# Patient Record
Sex: Male | Born: 1955 | Race: White | Hispanic: No | Marital: Married | Smoking: Former smoker
Health system: Southern US, Community
[De-identification: ages and names within clinical notes are randomized; demographics above are authoritative.]

## PROBLEM LIST (undated history)

## (undated) DIAGNOSIS — J189 Pneumonia, unspecified organism: Secondary | ICD-10-CM

## (undated) DIAGNOSIS — G4730 Sleep apnea, unspecified: Secondary | ICD-10-CM

## (undated) DIAGNOSIS — C801 Malignant (primary) neoplasm, unspecified: Secondary | ICD-10-CM

## (undated) DIAGNOSIS — G479 Sleep disorder, unspecified: Secondary | ICD-10-CM

## (undated) DIAGNOSIS — M1990 Unspecified osteoarthritis, unspecified site: Secondary | ICD-10-CM

## (undated) DIAGNOSIS — I10 Essential (primary) hypertension: Secondary | ICD-10-CM

## (undated) DIAGNOSIS — Z87442 Personal history of urinary calculi: Secondary | ICD-10-CM

## (undated) HISTORY — DX: Sleep disorder, unspecified: G47.9

## (undated) HISTORY — PX: CYSTOSCOPY WITH HOLMIUM LASER LITHOTRIPSY: SHX6639

## (undated) HISTORY — PX: APPENDECTOMY: SHX54

## (undated) HISTORY — PX: FRACTURE SURGERY: SHX138

## (undated) HISTORY — PX: TONSILLECTOMY: SUR1361

---

## 1997-08-03 ENCOUNTER — Ambulatory Visit (HOSPITAL_COMMUNITY): Admission: RE | Admit: 1997-08-03 | Discharge: 1997-08-03 | Payer: Self-pay | Admitting: Family Medicine

## 1998-06-10 ENCOUNTER — Encounter: Payer: Self-pay | Admitting: Emergency Medicine

## 1998-06-10 ENCOUNTER — Emergency Department (HOSPITAL_COMMUNITY): Admission: EM | Admit: 1998-06-10 | Discharge: 1998-06-10 | Payer: Self-pay | Admitting: Emergency Medicine

## 2001-11-25 ENCOUNTER — Encounter: Payer: Self-pay | Admitting: Emergency Medicine

## 2001-11-25 ENCOUNTER — Emergency Department (HOSPITAL_COMMUNITY): Admission: AC | Admit: 2001-11-25 | Discharge: 2001-11-25 | Payer: Self-pay

## 2002-02-13 ENCOUNTER — Encounter (INDEPENDENT_AMBULATORY_CARE_PROVIDER_SITE_OTHER): Payer: Self-pay | Admitting: *Deleted

## 2002-02-13 ENCOUNTER — Inpatient Hospital Stay (HOSPITAL_COMMUNITY): Admission: EM | Admit: 2002-02-13 | Discharge: 2002-02-14 | Payer: Self-pay | Admitting: *Deleted

## 2006-10-12 ENCOUNTER — Ambulatory Visit: Payer: Self-pay | Admitting: Gastroenterology

## 2006-10-24 ENCOUNTER — Ambulatory Visit: Payer: Self-pay | Admitting: Gastroenterology

## 2006-10-24 ENCOUNTER — Encounter: Payer: Self-pay | Admitting: Gastroenterology

## 2010-07-15 NOTE — Op Note (Signed)
NAME:  Mark Li, Mark Li NO.:  [id number]   MEDICAL RECORD NO.:  [id number]                   PATIENT TYPE:  INP   LOCATION:  5725                                 FACILITY:  [facility]   PHYSICIAN:  Ollen Gross. Vernell Morgans, M.D.              DATE OF BIRTH:  1955-05-01   DATE OF PROCEDURE:  DATE OF DISCHARGE:  02/14/2002                                 OPERATIVE REPORT   PREOPERATIVE DIAGNOSIS:  Appendicitis.   POSTOPERATIVE DIAGNOSIS:  Appendicitis.   PROCEDURE:  Laparoscopic appendectomy.   SURGEON:  Dr. Carolynne Edouard.   ANESTHESIA:  General endotracheal.   DESCRIPTION OF PROCEDURE:  After informed consent was obtained, the patient  was brought to the operating room and placed into the supine position on the  operating room table.  After adequate induction of general anesthesia, the  patient's abdomen was prepped with Betadine and draped in the usual sterile  manner.  The area above the umbilicus was infiltrated with 0.25% Marcaine  and a small incision was made with the 15 blade knife.  This incision was  carried down through the subcutaneous tissue bluntly with Kelly clamps and  Avary retractors, the linea alba was identified.  The linea alba was incised  with a 15 blade knife and each side was grasped with Kocher clamps and  elevated anteriorly.  The properitoneal space was then probed bluntly with  the hemostat until the peritoneum was opened and access was gained to the  abdominal cavity.  0 Vicryl purse string stitch was then placed in the  fascia surrounding this opening.  Hasson cannula was placed through this  opening and anchored in place with previously placed Vicryl purse string  stitch.  The abdomen was then insufflated with carbon dioxide without  difficulty.  The patient was placed in Trendelenburg position with the left  side down and the right side of the abdomen was inspected.  The cecum was  found to be sitting in a fairly high position, near the  inferior edge of the  liver.  The appendix was identified and it appeared to be inflamed.  Next,  sites were chosen on the abdomen, one in the lower midline and the other in  the epigastric area for placement of working ports.  These areas were  infiltrated with 0.25% Marcaine.  Small incisions were made with the 15  blade knife, and in the epigastric region, a 5 mm port, in the suprapubic  region, a 12 mm port were placed bluntly through these incisions, into the  abdominal cavity under direct vision.  Next, a Boston retractor was placed  through the upper working port and used to grasp the appendix and elevate it  anteriorly.  A harmonic scalpel was placed through the 12 mm port and using  the harmonic scalpel, the mesoappendix was taken down sharply.  Once this  was accomplished, the base of the appendix at  its junction with the cecum  was able to be identified and was cleared of any debris.  An endoscopic GIA  stapler was then placed through the 12 mm port and pushed across the base of  the appendix at its junction with the cecum.  There was good visualization  of this.  The stapling device was then clamped and fired, dividing the base  of the appendix without difficulty.  The stapling device was then released  and removed from the patient.  An endoscopic bag was placed through the 12  mm port and the appendix was placed within the bag and the bag was sealed.  The staple line at the cecum was inspected and appeared to be intact and  hemostatic.  The area was then irrigated with copious amounts of saline.  The camera was then moved to the 12 mm port and endoscopic grasper was  placed through the Hasson cannula and used to grasp the endoscopic bag.  The  bag with the appendix was then removed with the Hasson cannula through the  supraumbilical port.  The fascial defect was then closed with the previously  placed Vicryl purse string stitch.  The rest of the ports were removed under  direct  vision and were hemostatic.  The gas was allowed to escape through  the suprapubic 12 mm port, once removed, the fascia was closed with an  interrupted 0 Vicryl stitch.  The skin incisions were closed with interrupted 4-0 Monocryl subcuticular  stitches, Benzoin and Steri-Strips were applied.  The patient tolerated the  procedure well.  At the end of the case, all needle, sponge and instrument  counts were correct.  The patient was then awakened and taken to the recover  room in stable condition.                                               Ollen Gross. Vernell Morgans, M.D.    PST/MEDQ  D:  02/24/2002  T:  02/24/2002  Job:  [id number]

## 2011-06-01 ENCOUNTER — Ambulatory Visit (INDEPENDENT_AMBULATORY_CARE_PROVIDER_SITE_OTHER): Payer: BC Managed Care – PPO | Admitting: Internal Medicine

## 2011-06-01 VITALS — BP 145/91 | HR 62 | Temp 98.2°F | Resp 16 | Ht 72.0 in | Wt 246.0 lb

## 2011-06-01 DIAGNOSIS — R51 Headache: Secondary | ICD-10-CM

## 2011-06-01 DIAGNOSIS — J029 Acute pharyngitis, unspecified: Secondary | ICD-10-CM

## 2011-06-01 LAB — POCT RAPID STREP A (OFFICE): Rapid Strep A Screen: NEGATIVE

## 2011-06-01 LAB — POCT CBC
Granulocyte percent: 53.2 %G (ref 37–80)
HCT, POC: 50.3 % (ref 43.5–53.7)
Hemoglobin: 16.8 g/dL (ref 14.1–18.1)
MCV: 93.3 fL (ref 80–97)
POC LYMPH PERCENT: 34.7 %L (ref 10–50)
RDW, POC: 14 %

## 2011-06-01 MED ORDER — IPRATROPIUM BROMIDE 0.06 % NA SOLN: 2.0000 | Freq: Four times a day (QID) | NASAL | Status: DC

## 2011-06-01 MED ORDER — KETOROLAC TROMETHAMINE 60 MG/2ML IM SOLN
60.0000 mg | Freq: Once | INTRAMUSCULAR | Status: AC
  Administered 2011-06-01: 60 mg via INTRAMUSCULAR

## 2011-06-01 MED ORDER — MELOXICAM 7.5 MG PO TABS: 7.5000 mg | ORAL_TABLET | Freq: Every day | ORAL | Status: DC

## 2011-06-01 NOTE — Patient Instructions (Addendum)
Take your Mobic as needed for headache and use your nasal spray for congestion.  Return or call if not better in 2-3 days.  Recheck your blood pressure and record over the next month, it may be time to consider medication for your high blood pressure.   Upper Respiratory Infection, Adult An upper respiratory infection (URI) is also known as the common cold. It is often caused by a type of germ (virus). Colds are easily spread (contagious). You can pass it to others by kissing, coughing, sneezing, or drinking out of the same glass. Usually, you get better in 1 or 2 weeks.  HOME CARE   Only take medicine as told by your doctor.   Use a warm mist humidifier or breathe in steam from a hot shower.   Drink enough water and fluids to keep your pee (urine) clear or pale yellow.   Get plenty of rest.   Return to work when your temperature is back to normal or as told by your doctor. You may use a face mask and wash your hands to stop your cold from spreading.  GET HELP RIGHT AWAY IF:   After the first few days, you feel you are getting worse.   You have questions about your medicine.   You have chills, shortness of breath, or brown or red spit (mucus).   You have yellow or brown snot (nasal discharge) or pain in the face, especially when you bend forward.   You have a fever, puffy (swollen) neck, pain when you swallow, or white spots in the back of your throat.   You have a bad headache, ear pain, sinus pain, or chest pain.   You have a high-pitched whistling sound when you breathe in and out (wheezing).   You have a lasting cough or cough up blood.   You have sore muscles or a stiff neck.  MAKE SURE YOU:   Understand these instructions.   Will watch your condition.   Will get help right away if you are not doing well or get worse.  Document Released: 08/02/2007 Document Revised: 02/02/2011 Document Reviewed: 06/20/2010 [facility] Patient Information 2012 [place], [place].

## 2011-06-01 NOTE — Progress Notes (Signed)
  Subjective:    Patient ID: Mark Li, male    DOB: [birth year], 56 y.o.   MRN: [id number]  URI  This is a new problem. The current episode started in the past 7 days. The problem has been gradually improving. There has been no fever. Associated symptoms include headaches, rhinorrhea and sneezing. Pertinent negatives include no coughing, neck pain, plugged ear sensation, vomiting or wheezing. He has tried nothing for the symptoms.  Mark Li is here with complaints of sore throat which started four days ago, accompanied by rhinitis.  His headache started yesterday evening and is across the top of his forehead and face, he is carrying a roll of toilet paper to manage his rhinitis. His headache pain is persistant, not throbbing, sort of "like a toothache".   He denies allergies.  He has had no cough, fever, chills or stomach problems.  He has not taken any medication for his headache which did not prevent him from sleeping but was present on awakening.  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  No joint pain or rash.  In the past he has been told he has hypertension but he does not think he needs medication for this.    Review of Systems  HENT: Positive for rhinorrhea and sneezing. Negative for neck pain.   Respiratory: Negative for cough and wheezing.   Gastrointestinal: Negative for vomiting.  Neurological: Positive for headaches.  All other systems reviewed and are negative.       Objective:   Physical Exam  Vitals reviewed. Constitutional: He is oriented to person, place, and time. He appears well-developed and well-nourished.  HENT:  Head: Normocephalic.  Right Ear: External ear normal.  Left Ear: External ear normal.  Mouth/Throat: Oropharynx is clear and moist. No oropharyngeal exudate.  Eyes: Conjunctivae are normal.  Neck: Neck supple.  Cardiovascular: Normal rate, regular rhythm and normal heart sounds.   Pulmonary/Chest: Effort normal and breath  sounds normal.  Abdominal: Soft.  Lymphadenopathy:    He has no cervical adenopathy.  Neurological: He is alert and oriented to person, place, and time. No cranial nerve deficit.  Skin: Skin is warm and dry.  Psychiatric: He has a normal mood and affect. His behavior is normal.          Assessment & Plan:  URI with headache.  CBC and RS are normal.  ESR is 20.  Toradol 60 mg IM given; Atrovent Nasal Spray for his sinus congeston and Mobic7.5 mg if needed for a few days for pain.  He is to RTC if his symptoms do not resolve.  He states his headache felt a bit better before leaving the office today.  AVS printed and given pt.

## 2011-11-24 ENCOUNTER — Other Ambulatory Visit: Payer: Self-pay

## 2011-11-24 ENCOUNTER — Ambulatory Visit (INDEPENDENT_AMBULATORY_CARE_PROVIDER_SITE_OTHER): Payer: BC Managed Care – PPO | Admitting: Emergency Medicine

## 2011-11-24 VITALS — BP 132/78 | HR 97 | Temp 97.8°F | Resp 16 | Ht 72.0 in | Wt 248.0 lb

## 2011-11-24 DIAGNOSIS — R42 Dizziness and giddiness: Secondary | ICD-10-CM

## 2011-11-24 DIAGNOSIS — R279 Unspecified lack of coordination: Secondary | ICD-10-CM

## 2011-11-24 DIAGNOSIS — R270 Ataxia, unspecified: Secondary | ICD-10-CM

## 2011-11-24 DIAGNOSIS — R51 Headache: Secondary | ICD-10-CM

## 2011-11-24 MED ORDER — MECLIZINE HCL 50 MG PO TABS: 50.0000 mg | ORAL_TABLET | Freq: Three times a day (TID) | ORAL | Status: DC | PRN

## 2011-11-24 NOTE — Progress Notes (Signed)
[facility] [address], [place] [address] [phone]  Date:  11/24/2011   Name:  Mark Li   DOB:  [birth year]   MRN:  [id number]  PCP:  Tally Due, MD    Chief Complaint: Nausea and Dizziness   History of Present Illness:  Mark Li is a 56 y.o. very pleasant male patient who presents with the following:  Experienced left ear pain that was sharp and lasted about 30 minutes on Monday.  Night before last experienced profound vertigo associated with rolling over in bed that came in three waves.  Each episode lasted only 6-8 seconds.  Last night experienced the same.  Was asymptomatic during the day yesterday.  Had a similar experience last night with multiple episodes of awakening with vertigo last night.  Awoke this morning and had some ataxia and dizziness associated with nausea and vomiting.  Denies antecedent illness, fever or chills, facial asymmetry, numbness, tingling, weakness, difficulty with speech.  Some unsteadiness of gait.  No visual symptoms, fever or chills.  No chest pain, shortness of breath, palpitations, sensation of rapid or irregular heart rate.  Not taking medications.  No medical issues  There is no problem list on file for this patient.   No past medical history on file.  No past surgical history on file.  History  Substance Use Topics  . Smoking status: Former Games developer  . Smokeless tobacco: Not on file  . Alcohol Use: Not on file    No family history on file.  Allergies  Allergen Reactions  . Amoxicillin   . Penicillins   . Zithromax (Azithromycin Dihydrate)     Medication list has been reviewed and updated.  No current outpatient prescriptions on file prior to visit.    Review of Systems:  As per HPI, otherwise negative.    Physical Examination: Filed Vitals:   11/24/11 0947  BP: 132/78  Pulse: 97  Temp: 97.8 F (36.6 C)  Resp: 16   Filed Vitals:   11/24/11 0947  Height: 6' (1.829 m)   Weight: 248 lb (112.492 kg)   Body mass index is 33.63 kg/(m^2). Ideal Body Weight: Weight in (lb) to have BMI = 25: 183.9   GEN: WDWN, NAD, Non-toxic, A & O x 3.  No icterus or sepsis.  No rash HEENT: Atraumatic, Normocephalic. Neck supple. No masses, No LAD.  Oropharynx negative.  PRRERLA, EOMI fundi benign Ears and Nose: No external deformity. TM negative NECK:  Supple no masses or megaly.  No bruit CV: RRR, No M/G/R. No JVD. No thrill. No extra heart sounds. PULM: CTA B, no wheezes, crackles, rhonchi. No retractions. No resp. distress. No accessory muscle use. ABD: S, NT, ND, +BS. No rebound. No HSM. EXTR: No c/c/e NEURO Normal gait. Impaired tandem gait, normal romberg, heel and toe walking.  CN 2-12 intact.  Grip and motor strength symmetrical PSYCH: Normally interactive. Conversant. Not depressed or anxious appearing.  Calm demeanor.    Assessment and Plan: Vertigo. ASA daily antivert MRI Follow up with Dr Hulan Saas, Tessa Lerner, MD  I have reviewed and agree with documentation. Robert P. Merla Riches, M.D.

## 2011-11-26 NOTE — Progress Notes (Signed)
Reviewed and agree.

## 2011-12-26 ENCOUNTER — Other Ambulatory Visit: Payer: Self-pay | Admitting: Dermatology

## 2012-02-01 ENCOUNTER — Telehealth: Payer: Self-pay

## 2012-02-02 NOTE — Telephone Encounter (Signed)
None

## 2012-03-27 ENCOUNTER — Ambulatory Visit (INDEPENDENT_AMBULATORY_CARE_PROVIDER_SITE_OTHER): Payer: BC Managed Care – PPO | Admitting: Family Medicine

## 2012-03-27 ENCOUNTER — Ambulatory Visit: Payer: BC Managed Care – PPO

## 2012-03-27 VITALS — BP 131/83 | HR 64 | Temp 98.0°F | Resp 18 | Ht 72.0 in | Wt 245.4 lb

## 2012-03-27 DIAGNOSIS — R059 Cough, unspecified: Secondary | ICD-10-CM

## 2012-03-27 DIAGNOSIS — R05 Cough: Secondary | ICD-10-CM

## 2012-03-27 DIAGNOSIS — J40 Bronchitis, not specified as acute or chronic: Secondary | ICD-10-CM

## 2012-03-27 DIAGNOSIS — J069 Acute upper respiratory infection, unspecified: Secondary | ICD-10-CM

## 2012-03-27 DIAGNOSIS — R52 Pain, unspecified: Secondary | ICD-10-CM

## 2012-03-27 LAB — POCT INFLUENZA A/B
Influenza A, POC: NEGATIVE
Influenza B, POC: NEGATIVE

## 2012-03-27 NOTE — Progress Notes (Signed)
[facility]:  Office Visit  Chief Complaint:  Chief Complaint  Patient presents with  . Cough    started on Sunday and now coughing, chest congestion and nasal congestion.  coughing up green mucus  . Sinusitis    HPI: Mark Li is a 57 y.o. male who complains of  3 day history of sinus drainage, cough with productive green sputum. Has not tried anything for it. Nonsmoker currently, former smoker. Wife is sick. Denies fevers, chills, CP, SOB. Denies ear pain, facial pain. Mostly feels congestion in his chest.   History reviewed. No pertinent past medical history. History reviewed. No pertinent past surgical history. History   Social History  . Marital Status: Married    Spouse Name: N/A    Number of Children: N/A  . Years of Education: N/A   Social History Main Topics  . Smoking status: Former Games developer  . Smokeless tobacco: None  . Alcohol Use: None  . Drug Use: None  . Sexually Active: None   Other Topics Concern  . None   Social History Narrative  . None   History reviewed. No pertinent family history. Allergies  Allergen Reactions  . Amoxicillin   . Penicillins   . Zithromax (Azithromycin Dihydrate)    Prior to Admission medications   Medication Sig Start Date End Date Taking? Authorizing Provider  meclizine (ANTIVERT) 50 MG tablet Take 1 tablet (50 mg total) by mouth 3 (three) times daily as needed. 11/24/11   Phillips Odor, MD     ROS: The patient denies fevers, chills, night sweats, unintentional weight loss, chest pain, palpitations, wheezing, dyspnea on exertion, nausea, vomiting, abdominal pain, dysuria, hematuria, melena, numbness, weakness, or tingling.   All other systems have been reviewed and were otherwise negative with the exception of those mentioned in the HPI and as above.    PHYSICAL EXAM: Filed Vitals:   03/27/12 1312  BP: 131/83  Pulse: 64  Temp: 98 F (36.7 C)  Resp: 18   Filed Vitals:   03/27/12 1312    Height: 6' (1.829 m)  Weight: 245 lb 6.4 oz (111.313 kg)   Body mass index is 33.28 kg/(m^2).  General: Alert, no acute distress HEENT:  Normocephalic, atraumatic, oropharynx patent. TM nl. No exudates. No sinus tenderness. Cardiovascular:  Regular rate and rhythm, no rubs murmurs or gallops.  No Carotid bruits, radial pulse intact. No pedal edema.  Respiratory: Clear to auscultation bilaterally.  No wheezes, rales, or rhonchi.  No cyanosis, no use of accessory musculature GI: No organomegaly, abdomen is soft and non-tender, positive bowel sounds.  No masses. Skin: No rashes. Neurologic: Facial musculature symmetric. Psychiatric: Patient is appropriate throughout our interaction. Lymphatic: No cervical lymphadenopathy Musculoskeletal: Gait intact.   LABS: Results for orders placed in visit on 03/27/12  POCT INFLUENZA A/B      Component Value Range   Influenza A, POC Negative     Influenza B, POC Negative       EKG/XRAY:   Primary read interpreted by Dr. Conley Rolls at [facility]. Bronchitic changes No pneumothorax, obvious infiltrates   ASSESSMENT/PLAN: Encounter Diagnoses  Name Primary?  . Cough Yes  . Body aches   . Acute bronchitis    Most likely viral in origin Will monitor. Advise to avoid work for 2-3 days. If sxs worsen then will prescribe abx (He has a PCN and Zpack allergy) Sxs treatment with Mucinex D, Cepacol prn F/u prn     Tomi Grandpre PHUONG, DO 03/27/2012  2:30 PM

## 2012-03-28 ENCOUNTER — Other Ambulatory Visit: Payer: Self-pay | Admitting: Family Medicine

## 2012-03-28 ENCOUNTER — Telehealth: Payer: Self-pay

## 2012-03-28 DIAGNOSIS — J069 Acute upper respiratory infection, unspecified: Secondary | ICD-10-CM

## 2012-03-28 MED ORDER — LEVOFLOXACIN 500 MG PO TABS: 500.0000 mg | ORAL_TABLET | Freq: Every day | ORAL | Status: DC

## 2012-03-28 NOTE — Telephone Encounter (Signed)
Dr. Conley Rolls offered medicines to the patient yesterday.  He wants to take her up on this today.   Pharmacy on record - [address]   Please call when ready [phone]

## 2012-11-28 ENCOUNTER — Ambulatory Visit (INDEPENDENT_AMBULATORY_CARE_PROVIDER_SITE_OTHER): Payer: BC Managed Care – PPO | Admitting: Emergency Medicine

## 2012-11-28 VITALS — BP 132/86 | HR 62 | Temp 98.2°F | Resp 18 | Ht 70.75 in | Wt 248.2 lb

## 2012-11-28 DIAGNOSIS — B356 Tinea cruris: Secondary | ICD-10-CM

## 2012-11-28 DIAGNOSIS — J069 Acute upper respiratory infection, unspecified: Secondary | ICD-10-CM

## 2012-11-28 LAB — GLUCOSE, POCT (MANUAL RESULT ENTRY): POC Glucose: 79 mg/dl (ref 70–99)

## 2012-11-28 MED ORDER — LEVOFLOXACIN 500 MG PO TABS: 500.0000 mg | ORAL_TABLET | Freq: Every day | ORAL | Status: DC

## 2012-11-28 MED ORDER — TERBINAFINE HCL 250 MG PO TABS: 250.0000 mg | ORAL_TABLET | Freq: Every day | ORAL | Status: DC

## 2012-11-28 MED ORDER — HYDROCOD POLST-CHLORPHEN POLST 10-8 MG/5ML PO LQCR: 5.0000 mL | Freq: Two times a day (BID) | ORAL | Status: DC | PRN

## 2012-11-28 NOTE — Patient Instructions (Addendum)
Jock Itch Jock itch is a fungal infection of the skin in the groin area. It is sometimes called "ringworm" even though it is not caused by a worm. A fungus is a type of germ that thrives in dark, damp places.  CAUSES  This infection may spread from:  A fungus infection elsewhere on the body (such as athlete's foot).  Sharing towels or clothing. This infection is more common in:  Hot, humid climates.  People who wear tight-fitting clothing or wet bathing suits for long periods of time.  Athletes.  Overweight people.  People with diabetes. SYMPTOMS  Jock itch causes the following symptoms:  Red, pink or brown rash in the groin. Rash may spread to the thighs, anus, and buttocks.  Itching. DIAGNOSIS  Your caregiver may make the diagnosis by looking at the rash. Sometimes a skin scraping will be sent to test for fungus. Testing can be done either by looking under the microscope or by doing a culture (test to try to grow the fungus). A culture can take up to 2 weeks to come back. TREATMENT  Jock itch may be treated with:  Skin cream or ointment to kill fungus.  Medicine by mouth to kill fungus.  Skin cream or ointment to calm the itching.  Compresses or medicated powders to dry the infected skin. HOME CARE INSTRUCTIONS   Be sure to treat the rash completely. Follow your caregiver's instructions. It can take a couple of weeks to treat. If you do not treat the infection long enough, the rash can come back.  Wear loose-fitting clothing.  Men should wear cotton boxer shorts.  Women should wear cotton underwear.  Avoid hot baths.  Dry the groin area well after bathing. SEEK MEDICAL CARE IF:   Your rash is worse.  Your rash is spreading.  Your rash returns after treatment is finished.  Your rash is not gone in 4 weeks. Fungal infections are slow to respond to treatment. Some redness may remain for several weeks after the fungus is gone. SEEK IMMEDIATE MEDICAL CARE  IF:  The area becomes red, warm, tender, and swollen.  You have a fever. Document Released: 02/03/2002 Document Revised: 05/08/2011 Document Reviewed: 01/03/2008 [employer] Patient Information 2014 [employer].  

## 2012-11-28 NOTE — Progress Notes (Addendum)
[facility] [address] [place] [address] [phone]  Date:  11/28/2012   Name:  Mark Li   DOB:  05-Mar-1955   MRN:  [id number]  PCP:  Tally Due, MD    Chief Complaint: Rash, Nasal Congestion, Sore Throat, Headache and Cough   History of Present Illness:  Mark Li is a 57 y.o. very pleasant male patient who presents with the following:  Ill since Sunday with nasal congestion. Has headache, clear nasal discharge.  No nausea or vomiting.  Non productive cough.  No wheezing or shortness of breath.  No nausea or vomiting.  Fatigued.  No rash.  Muscle aches and jont pains.  Has sore throat. No improvement with over the counter medications or other home remedies. Has a groin rash.  Denies other complaint or health concern today.   Wife sick as well.    There are no active problems to display for this patient.   History reviewed. No pertinent past medical history.  Past Surgical History  Procedure Laterality Date  . Appendectomy      History  Substance Use Topics  . Smoking status: Former Games developer  . Smokeless tobacco: Not on file  . Alcohol Use: Not on file    Family History  Problem Relation Age of Onset  . COPD Mother   . Heart disease Father     Allergies  Allergen Reactions  . Amoxicillin   . Penicillins   . Zithromax [Azithromycin Dihydrate]     Medication list has been reviewed and updated.  Current Outpatient Prescriptions on File Prior to Visit  Medication Sig Dispense Refill  . levofloxacin (LEVAQUIN) 500 MG tablet Take 1 tablet (500 mg total) by mouth daily.  7 tablet  0  . meclizine (ANTIVERT) 50 MG tablet Take 1 tablet (50 mg total) by mouth 3 (three) times daily as needed.  30 tablet  0   No current facility-administered medications on file prior to visit.    Review of Systems:  As per HPI, otherwise negative.    Physical Examination: Filed Vitals:   11/28/12 1622  BP: 132/86  Pulse: 62   Temp: 98.2 F (36.8 C)  Resp: 18   Filed Vitals:   11/28/12 1622  Height: 5' 10.75" (1.797 m)  Weight: 248 lb 3.2 oz (112.583 kg)   Body mass index is 34.86 kg/(m^2). Ideal Body Weight: Weight in (lb) to have BMI = 25: 177.6  GEN: WDWN, NAD, Non-toxic, A & O x 3 HEENT: Atraumatic, Normocephalic. Neck supple. No masses, No LAD.  Oropharynx erythematous Ears and Nose: No external deformity. CV: RRR, No M/G/R. No JVD. No thrill. No extra heart sounds. PULM: CTA B, no wheezes, crackles, rhonchi. No retractions. No resp. distress. No accessory muscle use. ABD: S, NT, ND, +BS. No rebound. No HSM. EXTR: No c/c/e NEURO Normal gait.  PSYCH: Normally interactive. Conversant. Not depressed or anxious appearing.  Calm demeanor.  SKIN:  Tinea cruris   Assessment and Plan: Tinea cruris Bronchitis Sinusitis  Signed,  Phillips Odor, MD   Results for orders placed in visit on 11/28/12  GLUCOSE, POCT (MANUAL RESULT ENTRY)      Result Value Range   POC Glucose 79  70 - 99 mg/dl

## 2013-01-02 ENCOUNTER — Other Ambulatory Visit: Payer: Self-pay

## 2013-03-04 ENCOUNTER — Telehealth: Payer: Self-pay

## 2013-03-04 NOTE — Telephone Encounter (Signed)
Patient has an appt scheduled with Dr. Carlota Raspberry on Monday, 03/17/13, however is experiencing stomach discomfort and would like to be seen sooner.  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  Patient would like to know if Dr. Carlota Raspberry would work him in during his clinic on 03/10/13. Please advise.

## 2013-03-04 NOTE — Telephone Encounter (Signed)
If he is experiencing abdominal pain, may need to walk in to be seen sooner than next week, especially if acute onset. If this has been going on some time and he feels it could wait, we can schedule him for 4:15 next Monday, but if any cancellations, would prefer he be squeezed in earlier. It does look like a pretty full schedule that day, but we can try to squeeze him in as above. Come in to clinic or go to the emergency room if any worsening sooner.

## 2013-03-17 ENCOUNTER — Encounter: Payer: Self-pay | Admitting: Family Medicine

## 2013-03-17 ENCOUNTER — Ambulatory Visit (INDEPENDENT_AMBULATORY_CARE_PROVIDER_SITE_OTHER): Payer: BC Managed Care – PPO | Admitting: Family Medicine

## 2013-03-17 ENCOUNTER — Encounter (INDEPENDENT_AMBULATORY_CARE_PROVIDER_SITE_OTHER): Payer: BC Managed Care – PPO | Admitting: Family Medicine

## 2013-03-17 VITALS — BP 138/98 | HR 64 | Temp 98.0°F | Resp 16 | Ht 71.5 in | Wt 251.6 lb

## 2013-03-17 VITALS — BP 142/86 | HR 71 | Temp 98.3°F | Resp 16 | Ht 72.0 in | Wt 253.0 lb

## 2013-03-17 DIAGNOSIS — R5383 Other fatigue: Secondary | ICD-10-CM

## 2013-03-17 DIAGNOSIS — R200 Anesthesia of skin: Secondary | ICD-10-CM

## 2013-03-17 DIAGNOSIS — R0683 Snoring: Secondary | ICD-10-CM

## 2013-03-17 DIAGNOSIS — R5381 Other malaise: Secondary | ICD-10-CM

## 2013-03-17 DIAGNOSIS — G4700 Insomnia, unspecified: Secondary | ICD-10-CM

## 2013-03-17 DIAGNOSIS — R209 Unspecified disturbances of skin sensation: Secondary | ICD-10-CM

## 2013-03-17 DIAGNOSIS — R0989 Other specified symptoms and signs involving the circulatory and respiratory systems: Secondary | ICD-10-CM

## 2013-03-17 DIAGNOSIS — M7710 Lateral epicondylitis, unspecified elbow: Secondary | ICD-10-CM

## 2013-03-17 DIAGNOSIS — Z23 Encounter for immunization: Secondary | ICD-10-CM

## 2013-03-17 DIAGNOSIS — R202 Paresthesia of skin: Secondary | ICD-10-CM

## 2013-03-17 DIAGNOSIS — R0609 Other forms of dyspnea: Secondary | ICD-10-CM

## 2013-03-17 MED ORDER — ZOLPIDEM TARTRATE 5 MG PO TABS: 5.0000 mg | ORAL_TABLET | Freq: Every evening | ORAL | Status: DC | PRN

## 2013-03-17 NOTE — Progress Notes (Signed)
Subjective:    Patient ID: Mark Li, male    DOB: 22-Jan-1956, 58 y.o.   MRN: [id number]  This chart was scribed for [name], MD by Blanchard Kelch, ED Scribe. The patient was seen in room 5. Patient's care was started at 6:51 PM.  Chief Complaint  Patient presents with  . Abdominal Pain  . Insomnia  . Elbow Pain   PCP; [name], [name], MD   HPI  Mark Li is a 58 y.o. male who presents to office for multiple complaints. Initially scheduled for office visit earlier today. Returning for evaluation now due to wait.  Abdominal pain: He states he has a rectangular area on the epigastrium of his abdomen that goes numb. The numbness tends to come on with quick movements but can also come on at any time. It began about a month ago. He was crawling underneath his house doing work for a few weekends in a row scraping mildew right before the symptom began. The numbness is becoming more frequent and noticeable. He states the numbness has started to wake him up at night. He denies any actual pain with the numbness. He denies putting creams or lotions on it or taking any medication for it. He denies back pain, rash, swelling, blisters, nausea, vomiting, fever, constipation, diarrhea, blood in stool. He denies history of hernia.    Left elbow pain: He has constant, throbbing pain in his left elbow. The pain seems to taper off as the day goes on. He noticed at the same time as the numbness in his abdomen began about a month ago. He states he was scraping mildew off the house and believes the strenous activity is the cause of the pain. He denies playing golf, tennis or any other sports/exercise.  Insomnia: He states he has had intermittent insomnia for about twenty two years. He has been drinking alcohol for the problem, about two or three drinks in the evening and then wakes up about four hours later and sometimes will have another drink to try to help him get back to sleep. He  drinks about two or three drinks every night since he was [age]. He believes this may have been increased recently with the extra one or two in the middle of the night. He states he has gone for weeks before without alcohol without any withdrawal symptoms or complications. He denies any increased day drinking at social events. He reports increased snoring and wakes up gasping for air, which he believes is due to recent weight gain. He states he has never been diagnosed with sleep apnea. He denies ever taking Ambien or other medication for sleep. He denies any new stressors.    There are no active problems to display for this patient.  No past medical history on file. Past Surgical History  Procedure Laterality Date  . Appendectomy     Allergies  Allergen Reactions  . Amoxicillin   . Penicillins   . Zithromax [Azithromycin Dihydrate]    Prior to Admission medications   Medication Sig Start Date End Date Taking? Authorizing Provider  chlorpheniramine-HYDROcodone (TUSSIONEX PENNKINETIC ER) 10-8 MG/5ML LQCR Take 5 mLs by mouth every 12 (twelve) hours as needed. 11/28/12  Yes Phillips Odor, MD  levofloxacin (LEVAQUIN) 500 MG tablet Take 1 tablet (500 mg total) by mouth daily. 11/28/12  Yes Phillips Odor, MD  meclizine (ANTIVERT) 50 MG tablet Take 1 tablet (50 mg total) by mouth 3 (three) times daily as needed. 11/24/11  Yes Ellison Carwin, MD  terbinafine (LAMISIL) 250 MG tablet Take 1 tablet (250 mg total) by mouth daily. 11/28/12  Yes Ellison Carwin, MD   History   Social History  . Marital Status: Married    Spouse Name: N/A    Number of Children: N/A  . Years of Education: N/A   Occupational History  . Not on file.   Social History Main Topics  . Smoking status: Former Research scientist (life sciences)  . Smokeless tobacco: Not on file  . Alcohol Use: Yes  . Drug Use: No  . Sexual Activity: Not on file   Other Topics Concern  . Not on file   Social History Narrative  . No narrative on file        Review of Systems  Constitutional: Negative for fever.  HENT: Negative for drooling.   Eyes: Negative for discharge.  Respiratory: Negative for cough.   Cardiovascular: Negative for leg swelling.  Gastrointestinal: Negative for nausea, vomiting, abdominal pain, diarrhea and constipation.       Positive for abdominal numbness.   Endocrine: Negative for polyuria.  Genitourinary: Negative for hematuria.  Musculoskeletal: Positive for arthralgias. Negative for gait problem.  Skin: Negative for rash.  Allergic/Immunologic: Negative for immunocompromised state.  Neurological: Negative for speech difficulty.  Hematological: Negative for adenopathy.  Psychiatric/Behavioral: Positive for sleep disturbance. Negative for confusion. The patient is not nervous/anxious.        Objective:   Physical Exam  Nursing note and vitals reviewed. Constitutional: He is oriented to person, place, and time. He appears well-developed and well-nourished. No distress.  HENT:  Head: Normocephalic and atraumatic.  Eyes: EOM are normal.  Neck: Neck supple. No tracheal deviation present.  Cardiovascular: Normal rate.   Pulmonary/Chest: Effort normal. No respiratory distress.  Abdominal: Soft. There is no tenderness. There is no rebound and no guarding.  Just inferior to rib margin approximately 10 cm rectangular patch, described as numb. No rash, tenderness, abdominal wall defect.  Musculoskeletal: Normal range of motion. He exhibits tenderness.  LS ROM full, no change in abdominal numbness with ROM. No focal tenderness LS spine.  Tender to palpation lateral upper condyle. Pain with extension of elbow and downward grasp lifting. No pain or weakness with resisted left wrist extension or middle phalanx extension.   Neurological: He is alert and oriented to person, place, and time.  Skin: Skin is warm and dry.  Psychiatric: He has a normal mood and affect. His behavior is normal.    Filed Vitals:    03/17/13 1700  BP: 142/86  Pulse: 71  Temp: 98.3 F (36.8 C)  TempSrc: Oral  Resp: 16  Height: 6' (1.829 m)  Weight: 253 lb (114.76 kg)  SpO2: 95%       Assessment & Plan:   [name] is a 57 y.o. male Need for prophylactic vaccination and inoculation against influenza - flu vaccine given.   Snoring, Fatigue, Insomnia - Plan: zolpidem (AMBIEN) 5 MG tablet, Ambulatory referral to Sleep Studies - longstanding issue, but discussed concerns about restful sleep and alcohol use. Plans to decrease use (denies w/d sx's or difficulty in cutting back in past), and short term low dose ambien provided until can be evaluated by sleep medicine specialist.   Lateral epicondylitis (tennis elbow) on left. May be related to work under the house/repetitive use. Has counterforce brace at home, use discussed, and discussed typical motions that worsen this condition, especially downward grasp carrying. Recheck in the next 4-6 weeks.   Numbness  and tingling sensation of skin on abdomen. No rash, no apparent defect. Reassuring exam and may be cutaneous nerve based on description, also noted after climbing under house.  Can try to give this another 2-3 weeks to improve, then neuro eval if not resolving. sooner if worse.    Meds ordered this encounter  Medications  . zolpidem (AMBIEN) 5 MG tablet    Sig: Take 1-2 tablets (5-10 mg total) by mouth at bedtime as needed for sleep.    Dispense:  15 tablet    Refill:  1   Patient Instructions  We will refer you to sleep medicine for evaluation, decrease to no more than one drink at bedtime. Can take one Ambien to sleep. If this does not help, a second one can be taken, but be careful taking this medication with alcohol. Avoid repetitive bending, twisting or lifting with left arm and see handout on tennis elbow. Your exam appears normal on the abdomen but this may be an overuse injury from climbing under the house. If not improving in next few weeks, we can  have Neurology evaluate this as well. Return to the clinic or go to the nearest emergency room if any of your symptoms worsen or new symptoms occur.     Insomnia Insomnia is frequent trouble falling and/or staying asleep. Insomnia can be a long term problem or a short term problem. Both are common. Insomnia can be a short term problem when the wakefulness is related to a certain stress or worry. Long term insomnia is often related to ongoing stress during waking hours and/or poor sleeping habits. Overtime, sleep deprivation itself can make the problem worse. Every little thing feels more severe because you are overtired and your ability to cope is decreased. CAUSES   Stress, anxiety, and depression.  Poor sleeping habits.  Distractions such as TV in the bedroom.  Naps close to bedtime.  Engaging in emotionally charged conversations before bed.  Technical reading before sleep.  Alcohol and other sedatives. They may make the problem worse. They can hurt normal sleep patterns and normal dream activity.  Stimulants such as caffeine for several hours prior to bedtime.  Pain syndromes and shortness of breath can cause insomnia.  Exercise late at night.  Changing time zones may cause sleeping problems (jet lag). It is sometimes helpful to have someone observe your sleeping patterns. They should look for periods of not breathing during the night (sleep apnea). They should also look to see how long those periods last. If you live alone or observers are uncertain, you can also be observed at a sleep clinic where your sleep patterns will be professionally monitored. Sleep apnea requires a checkup and treatment. Give your caregivers your medical history. Give your caregivers observations your family has made about your sleep.  SYMPTOMS   Not feeling rested in the morning.  Anxiety and restlessness at bedtime.  Difficulty falling and staying asleep. TREATMENT   Your caregiver may prescribe  treatment for an underlying medical disorders. Your caregiver can give advice or help if you are using alcohol or other drugs for self-medication. Treatment of underlying problems will usually eliminate insomnia problems.  Medications can be prescribed for short time use. They are generally not recommended for lengthy use.  Over-the-counter sleep medicines are not recommended for lengthy use. They can be habit forming.  You can promote easier sleeping by making lifestyle changes such as:  Using relaxation techniques that help with breathing and reduce muscle tension.  Exercising  earlier in the day.  Changing your diet and the time of your last meal. No night time snacks.  Establish a regular time to go to bed.  Counseling can help with stressful problems and worry.  Soothing music and white noise may be helpful if there are background noises you cannot remove.  Stop tedious detailed work at least one hour before bedtime. HOME CARE INSTRUCTIONS   Keep a diary. Inform your caregiver about your progress. This includes any medication side effects. See your caregiver regularly. Take note of:  Times when you are asleep.  Times when you are awake during the night.  The quality of your sleep.  How you feel the next day. This information will help your caregiver care for you.  Get out of bed if you are still awake after 15 minutes. Read or do some quiet activity. Keep the lights down. Wait until you feel sleepy and go back to bed.  Keep regular sleeping and waking hours. Avoid naps.  Exercise regularly.  Avoid distractions at bedtime. Distractions include watching television or engaging in any intense or detailed activity like attempting to balance the household checkbook.  Develop a bedtime ritual. Keep a familiar routine of bathing, brushing your teeth, climbing into bed at the same time each night, listening to soothing music. Routines increase the success of falling to sleep  faster.  Use relaxation techniques. This can be using breathing and muscle tension release routines. It can also include visualizing peaceful scenes. You can also help control troubling or intruding thoughts by keeping your mind occupied with boring or repetitive thoughts like the old concept of counting sheep. You can make it more creative like imagining planting one beautiful flower after another in your backyard garden.  During your day, work to eliminate stress. When this is not possible use some of the previous suggestions to help reduce the anxiety that accompanies stressful situations. MAKE SURE YOU:   Understand these instructions.  Will watch your condition.  Will get help right away if you are not doing well or get worse. Document Released: 02/11/2000 Document Revised: 05/08/2011 Document Reviewed: 03/13/2007 [facility] Patient Information 2014 [place].    I personally performed the services described in this documentation, which was scribed in my presence. The recorded information has been reviewed and considered, and addended by me as needed.

## 2013-03-17 NOTE — Patient Instructions (Signed)
We will refer you to sleep medicine for evaluation, decrease to no more than one drink at bedtime. Can take one Ambien to sleep. If this does not help, a second one can be taken, but be careful taking this medication with alcohol. Avoid repetitive bending, twisting or lifting with left arm and see handout on tennis elbow. Your exam appears normal on the abdomen but this may be an overuse injury from climbing under the house. If not improving in next few weeks, we can have Neurology evaluate this as well. Return to the clinic or go to the nearest emergency room if any of your symptoms worsen or new symptoms occur.     Insomnia Insomnia is frequent trouble falling and/or staying asleep. Insomnia can be a long term problem or a short term problem. Both are common. Insomnia can be a short term problem when the wakefulness is related to a certain stress or worry. Long term insomnia is often related to ongoing stress during waking hours and/or poor sleeping habits. Overtime, sleep deprivation itself can make the problem worse. Every little thing feels more severe because you are overtired and your ability to cope is decreased. CAUSES   Stress, anxiety, and depression.  Poor sleeping habits.  Distractions such as TV in the bedroom.  Naps close to bedtime.  Engaging in emotionally charged conversations before bed.  Technical reading before sleep.  Alcohol and other sedatives. They may make the problem worse. They can hurt normal sleep patterns and normal dream activity.  Stimulants such as caffeine for several hours prior to bedtime.  Pain syndromes and shortness of breath can cause insomnia.  Exercise late at night.  Changing time zones may cause sleeping problems (jet lag). It is sometimes helpful to have someone observe your sleeping patterns. They should look for periods of not breathing during the night (sleep apnea). They should also look to see how long those periods last. If you live  alone or observers are uncertain, you can also be observed at a sleep clinic where your sleep patterns will be professionally monitored. Sleep apnea requires a checkup and treatment. Give your caregivers your medical history. Give your caregivers observations your family has made about your sleep.  SYMPTOMS   Not feeling rested in the morning.  Anxiety and restlessness at bedtime.  Difficulty falling and staying asleep. TREATMENT   Your caregiver may prescribe treatment for an underlying medical disorders. Your caregiver can give advice or help if you are using alcohol or other drugs for self-medication. Treatment of underlying problems will usually eliminate insomnia problems.  Medications can be prescribed for short time use. They are generally not recommended for lengthy use.  Over-the-counter sleep medicines are not recommended for lengthy use. They can be habit forming.  You can promote easier sleeping by making lifestyle changes such as:  Using relaxation techniques that help with breathing and reduce muscle tension.  Exercising earlier in the day.  Changing your diet and the time of your last meal. No night time snacks.  Establish a regular time to go to bed.  Counseling can help with stressful problems and worry.  Soothing music and white noise may be helpful if there are background noises you cannot remove.  Stop tedious detailed work at least one hour before bedtime. HOME CARE INSTRUCTIONS   Keep a diary. Inform your caregiver about your progress. This includes any medication side effects. See your caregiver regularly. Take note of:  Times when you are asleep.  Times when  you are awake during the night.  The quality of your sleep.  How you feel the next day. This information will help your caregiver care for you.  Get out of bed if you are still awake after 15 minutes. Read or do some quiet activity. Keep the lights down. Wait until you feel sleepy and go back to  bed.  Keep regular sleeping and waking hours. Avoid naps.  Exercise regularly.  Avoid distractions at bedtime. Distractions include watching television or engaging in any intense or detailed activity like attempting to balance the household checkbook.  Develop a bedtime ritual. Keep a familiar routine of bathing, brushing your teeth, climbing into bed at the same time each night, listening to soothing music. Routines increase the success of falling to sleep faster.  Use relaxation techniques. This can be using breathing and muscle tension release routines. It can also include visualizing peaceful scenes. You can also help control troubling or intruding thoughts by keeping your mind occupied with boring or repetitive thoughts like the old concept of counting sheep. You can make it more creative like imagining planting one beautiful flower after another in your backyard garden.  During your day, work to eliminate stress. When this is not possible use some of the previous suggestions to help reduce the anxiety that accompanies stressful situations. MAKE SURE YOU:   Understand these instructions.  Will watch your condition.  Will get help right away if you are not doing well or get worse. Document Released: 02/11/2000 Document Revised: 05/08/2011 Document Reviewed: 03/13/2007 [facility] Patient Information 2014 [place].

## 2013-03-18 ENCOUNTER — Encounter: Payer: Self-pay | Admitting: Family Medicine

## 2013-03-19 ENCOUNTER — Encounter: Payer: Self-pay | Admitting: Family Medicine

## 2013-03-19 NOTE — Progress Notes (Signed)
This encounter was created in error - please disregard.

## 2013-03-20 ENCOUNTER — Encounter: Payer: Self-pay | Admitting: Neurology

## 2013-03-20 ENCOUNTER — Ambulatory Visit (INDEPENDENT_AMBULATORY_CARE_PROVIDER_SITE_OTHER): Payer: BC Managed Care – PPO | Admitting: Neurology

## 2013-03-20 ENCOUNTER — Encounter: Payer: Self-pay | Admitting: Family Medicine

## 2013-03-20 VITALS — BP 139/97 | HR 61 | Temp 97.0°F | Ht 72.0 in | Wt 252.0 lb

## 2013-03-20 DIAGNOSIS — R404 Transient alteration of awareness: Secondary | ICD-10-CM

## 2013-03-20 DIAGNOSIS — R0989 Other specified symptoms and signs involving the circulatory and respiratory systems: Secondary | ICD-10-CM

## 2013-03-20 DIAGNOSIS — R0683 Snoring: Secondary | ICD-10-CM

## 2013-03-20 DIAGNOSIS — G479 Sleep disorder, unspecified: Secondary | ICD-10-CM

## 2013-03-20 DIAGNOSIS — R400 Somnolence: Secondary | ICD-10-CM

## 2013-03-20 DIAGNOSIS — E669 Obesity, unspecified: Secondary | ICD-10-CM

## 2013-03-20 DIAGNOSIS — R0609 Other forms of dyspnea: Secondary | ICD-10-CM

## 2013-03-20 HISTORY — DX: Sleep disorder, unspecified: G47.9

## 2013-03-20 NOTE — Patient Instructions (Addendum)
Based on your symptoms and your exam I believe you are at risk for obstructive sleep apnea or OSA, and I think we should proceed with a sleep study to determine whether you do or do not have OSA and how severe it is. If you have more than mild OSA, I want you to consider treatment with CPAP. Please remember, the risks and ramifications of moderate to severe obstructive sleep apnea or OSA are: Cardiovascular disease, including congestive heart failure, stroke, difficult to control hypertension, arrhythmias, and even type 2 diabetes has been linked to untreated OSA. Sleep apnea causes disruption of sleep and sleep deprivation in most cases, which, in turn, can cause recurrent headaches, problems with memory, mood, concentration, focus, and vigilance. Most people with untreated sleep apnea report excessive daytime sleepiness, which can affect their ability to drive. Please do not drive if you feel sleepy.  I will see you back after your sleep study to go over the test results and where to go from there. We will call you after your sleep study and to set up an appointment at the time.   Please remember to try to maintain good sleep hygiene, which means: Keep a regular sleep and wake schedule, try not to exercise or have a meal within 2 hours of your bedtime, try to keep your bedroom conducive for sleep, that is, cool and dark, without light distractors such as an illuminated alarm clock, and refrain from watching TV right before sleep or in the middle of the night and do not keep the TV or radio on during the night. Also, try not to use or play on electronic devices at bedtime, such as your cell phone, tablet PC or laptop. If you like to read at bedtime on an electronic device, try to dim the background light as much as possible. Do not eat in the middle of the night.   Please limit your alcohol use to one drink per day and do not utilize alcohol as a sleep aid. Do not drink alcohol with Ambien and do not drive  when sleepy. Reduce your caffeine intake.
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
## 2013-04-21 ENCOUNTER — Encounter: Payer: BC Managed Care – PPO | Admitting: Family Medicine

## 2015-01-04 ENCOUNTER — Telehealth: Payer: Self-pay

## 2015-07-14 DIAGNOSIS — G4700 Insomnia, unspecified: Secondary | ICD-10-CM | POA: Insufficient documentation

## 2015-07-14 DIAGNOSIS — C61 Malignant neoplasm of prostate: Secondary | ICD-10-CM | POA: Insufficient documentation

## 2015-10-15 DIAGNOSIS — G4730 Sleep apnea, unspecified: Secondary | ICD-10-CM | POA: Insufficient documentation

## 2015-11-18 DIAGNOSIS — I10 Essential (primary) hypertension: Secondary | ICD-10-CM | POA: Diagnosis present

## 2016-03-03 DIAGNOSIS — K219 Gastro-esophageal reflux disease without esophagitis: Secondary | ICD-10-CM | POA: Insufficient documentation

## 2016-08-11 ENCOUNTER — Encounter: Payer: Self-pay | Admitting: Gastroenterology

## 2017-09-14 DIAGNOSIS — Z85828 Personal history of other malignant neoplasm of skin: Secondary | ICD-10-CM | POA: Insufficient documentation

## 2018-08-10 ENCOUNTER — Inpatient Hospital Stay (HOSPITAL_COMMUNITY)
Admission: EM | Admit: 2018-08-10 | Discharge: 2018-08-14 | DRG: 177 | Disposition: A | Discharge status: Home / Self Care | Payer: BC Managed Care – PPO | Attending: Internal Medicine | Admitting: Internal Medicine

## 2018-08-10 ENCOUNTER — Encounter (HOSPITAL_COMMUNITY): Payer: Self-pay

## 2018-08-10 ENCOUNTER — Other Ambulatory Visit: Payer: Self-pay

## 2018-08-10 ENCOUNTER — Emergency Department (HOSPITAL_COMMUNITY): Payer: BC Managed Care – PPO

## 2018-08-10 DIAGNOSIS — J9601 Acute respiratory failure with hypoxia: Secondary | ICD-10-CM | POA: Diagnosis not present

## 2018-08-10 DIAGNOSIS — U071 COVID-19: Secondary | ICD-10-CM | POA: Diagnosis present

## 2018-08-10 DIAGNOSIS — Z6835 Body mass index (BMI) 35.0-35.9, adult: Secondary | ICD-10-CM | POA: Diagnosis not present

## 2018-08-10 DIAGNOSIS — Z8249 Family history of ischemic heart disease and other diseases of the circulatory system: Secondary | ICD-10-CM | POA: Diagnosis not present

## 2018-08-10 DIAGNOSIS — Z87891 Personal history of nicotine dependence: Secondary | ICD-10-CM

## 2018-08-10 DIAGNOSIS — Z79899 Other long term (current) drug therapy: Secondary | ICD-10-CM

## 2018-08-10 DIAGNOSIS — Z825 Family history of asthma and other chronic lower respiratory diseases: Secondary | ICD-10-CM | POA: Diagnosis not present

## 2018-08-10 DIAGNOSIS — R21 Rash and other nonspecific skin eruption: Secondary | ICD-10-CM | POA: Diagnosis present

## 2018-08-10 DIAGNOSIS — R740 Nonspecific elevation of levels of transaminase and lactic acid dehydrogenase [LDH]: Secondary | ICD-10-CM | POA: Diagnosis present

## 2018-08-10 DIAGNOSIS — J069 Acute upper respiratory infection, unspecified: Secondary | ICD-10-CM | POA: Diagnosis present

## 2018-08-10 DIAGNOSIS — I10 Essential (primary) hypertension: Secondary | ICD-10-CM | POA: Diagnosis present

## 2018-08-10 DIAGNOSIS — J1289 Other viral pneumonia: Secondary | ICD-10-CM | POA: Diagnosis not present

## 2018-08-10 DIAGNOSIS — E669 Obesity, unspecified: Secondary | ICD-10-CM | POA: Diagnosis not present

## 2018-08-10 DIAGNOSIS — J988 Other specified respiratory disorders: Secondary | ICD-10-CM | POA: Diagnosis not present

## 2018-08-10 DIAGNOSIS — R0902 Hypoxemia: Secondary | ICD-10-CM

## 2018-08-10 HISTORY — DX: Essential (primary) hypertension: I10

## 2018-08-10 LAB — FERRITIN: Ferritin: 262 ng/mL (ref 24–336)

## 2018-08-10 LAB — LACTIC ACID, PLASMA: Lactic Acid, Venous: 1.5 mmol/L (ref 0.5–1.9)

## 2018-08-10 LAB — CBC WITH DIFFERENTIAL/PLATELET
Abs Immature Granulocytes: 0.05 10*3/uL (ref 0.00–0.07)
Basophils Absolute: 0 10*3/uL (ref 0.0–0.1)
Basophils Relative: 1 %
Eosinophils Absolute: 0 10*3/uL (ref 0.0–0.5)
Eosinophils Relative: 1 %
HCT: 52.3 % — ABNORMAL HIGH (ref 39.0–52.0)
Hemoglobin: 17.3 g/dL — ABNORMAL HIGH (ref 13.0–17.0)
Immature Granulocytes: 1 %
Lymphocytes Relative: 38 %
Lymphs Abs: 1.4 10*3/uL (ref 0.7–4.0)
MCH: 31.1 pg (ref 26.0–34.0)
MCHC: 33.1 g/dL (ref 30.0–36.0)
MCV: 94.1 fL (ref 80.0–100.0)
Monocytes Absolute: 0.4 10*3/uL (ref 0.1–1.0)
Monocytes Relative: 12 %
Neutro Abs: 1.7 10*3/uL (ref 1.7–7.7)
Neutrophils Relative %: 47 %
Platelets: 127 10*3/uL — ABNORMAL LOW (ref 150–400)
RBC: 5.56 MIL/uL (ref 4.22–5.81)
RDW: 12.1 % (ref 11.5–15.5)
WBC: 3.6 10*3/uL — ABNORMAL LOW (ref 4.0–10.5)
nRBC: 0 % (ref 0.0–0.2)

## 2018-08-10 LAB — COMPREHENSIVE METABOLIC PANEL
ALT: 27 U/L (ref 0–44)
AST: 25 U/L (ref 15–41)
Albumin: 3.6 g/dL (ref 3.5–5.0)
Alkaline Phosphatase: 56 U/L (ref 38–126)
Anion gap: 11 (ref 5–15)
BUN: 14 mg/dL (ref 8–23)
CO2: 23 mmol/L (ref 22–32)
Calcium: 8.6 mg/dL — ABNORMAL LOW (ref 8.9–10.3)
Chloride: 102 mmol/L (ref 98–111)
Creatinine, Ser: 1.11 mg/dL (ref 0.61–1.24)
GFR calc Af Amer: >60 mL/min (ref >60)
GFR calc non Af Amer: >60 mL/min (ref >60)
Glucose, Bld: 91 mg/dL (ref 70–99)
Potassium: 3.9 mmol/L (ref 3.5–5.1)
Sodium: 136 mmol/L (ref 135–145)
Total Bilirubin: 0.7 mg/dL (ref 0.3–1.2)
Total Protein: 7 g/dL (ref 6.5–8.1)

## 2018-08-10 LAB — LACTATE DEHYDROGENASE: LDH: 189 U/L (ref 98–192)

## 2018-08-10 LAB — D-DIMER, QUANTITATIVE
D-Dimer, Quant: 0.5 ug/mL-FEU (ref 0.00–0.50)
D-Dimer, Quant: 0.45 ug/mL-FEU (ref 0.00–0.50)

## 2018-08-10 LAB — C-REACTIVE PROTEIN
CRP: 5.8 mg/dL — ABNORMAL HIGH (ref <1.0)
CRP: 4.9 mg/dL — ABNORMAL HIGH (ref <1.0)

## 2018-08-10 LAB — SARS CORONAVIRUS 2: SARS Coronavirus 2: DETECTED — AB

## 2018-08-10 LAB — TRIGLYCERIDES: Triglycerides: 98 mg/dL (ref <150)

## 2018-08-10 LAB — FIBRINOGEN: Fibrinogen: 623 mg/dL — ABNORMAL HIGH (ref 210–475)

## 2018-08-10 LAB — PROCALCITONIN: Procalcitonin: <0.1 ng/mL

## 2018-08-10 MED ORDER — ONDANSETRON HCL 4 MG/2ML IJ SOLN: 4.0000 mg | Freq: Four times a day (QID) | INTRAMUSCULAR | Status: DC | PRN

## 2018-08-10 MED ORDER — OXYCODONE HCL 5 MG PO TABS
5.0000 mg | ORAL_TABLET | ORAL | Status: DC | PRN
  Administered 2018-08-12: 5 mg via ORAL
  Filled 2018-08-10: qty 1

## 2018-08-10 MED ORDER — SODIUM CHLORIDE 0.9 % IV SOLN
100.0000 mg | Freq: Two times a day (BID) | INTRAVENOUS | Status: DC
  Administered 2018-08-10: 100 mg via INTRAVENOUS
  Filled 2018-08-10 (×2): qty 100

## 2018-08-10 MED ORDER — ACETAMINOPHEN 325 MG PO TABS
650.0000 mg | ORAL_TABLET | Freq: Four times a day (QID) | ORAL | Status: DC | PRN
  Administered 2018-08-13: 650 mg via ORAL
  Filled 2018-08-10: qty 2

## 2018-08-10 MED ORDER — ACETAMINOPHEN 325 MG PO TABS
650.0000 mg | ORAL_TABLET | Freq: Once | ORAL | Status: AC
  Administered 2018-08-10: 650 mg via ORAL
  Filled 2018-08-10: qty 2

## 2018-08-10 MED ORDER — ENOXAPARIN SODIUM 40 MG/0.4ML ~~LOC~~ SOLN
40.0000 mg | SUBCUTANEOUS | Status: DC
  Administered 2018-08-10 – 2018-08-14 (×5): 40 mg via SUBCUTANEOUS
  Filled 2018-08-10 (×5): qty 0.4

## 2018-08-10 MED ORDER — ONDANSETRON HCL 4 MG PO TABS: 4.0000 mg | ORAL_TABLET | Freq: Four times a day (QID) | ORAL | Status: DC | PRN

## 2018-08-10 MED ORDER — SODIUM CHLORIDE 0.9 % IV SOLN
100.0000 mg | INTRAVENOUS | Status: AC
  Administered 2018-08-11 – 2018-08-14 (×4): 100 mg via INTRAVENOUS
  Filled 2018-08-10 (×4): qty 20

## 2018-08-10 MED ORDER — BISACODYL 5 MG PO TBEC: 5.0000 mg | DELAYED_RELEASE_TABLET | Freq: Every day | ORAL | Status: DC | PRN

## 2018-08-10 MED ORDER — VANCOMYCIN HCL 10 G IV SOLR
2000.0000 mg | Freq: Once | INTRAVENOUS | Status: AC
  Administered 2018-08-10: 2000 mg via INTRAVENOUS
  Filled 2018-08-10: qty 2000

## 2018-08-10 MED ORDER — SODIUM CHLORIDE 0.9 % IV SOLN
200.0000 mg | Freq: Once | INTRAVENOUS | Status: AC
  Administered 2018-08-10: 200 mg via INTRAVENOUS
  Filled 2018-08-10: qty 40

## 2018-08-10 MED ORDER — LISINOPRIL 10 MG PO TABS
10.0000 mg | ORAL_TABLET | Freq: Every day | ORAL | Status: DC
  Administered 2018-08-11 – 2018-08-14 (×4): 10 mg via ORAL
  Filled 2018-08-10 (×6): qty 1

## 2018-08-10 MED ORDER — POLYETHYLENE GLYCOL 3350 17 G PO PACK: 17.0000 g | PACK | Freq: Every day | ORAL | Status: DC | PRN

## 2018-08-10 MED ORDER — METHYLPREDNISOLONE SODIUM SUCC 125 MG IJ SOLR
60.0000 mg | Freq: Two times a day (BID) | INTRAMUSCULAR | Status: AC
  Administered 2018-08-10 – 2018-08-13 (×6): 60 mg via INTRAVENOUS
  Filled 2018-08-10 (×6): qty 2

## 2018-08-10 MED ORDER — SODIUM CHLORIDE 0.9 % IV SOLN
2.0000 g | Freq: Once | INTRAVENOUS | Status: AC
  Administered 2018-08-10: 2 g via INTRAVENOUS
  Filled 2018-08-10: qty 2

## 2018-08-10 MED ORDER — DOCUSATE SODIUM 100 MG PO CAPS
100.0000 mg | ORAL_CAPSULE | Freq: Two times a day (BID) | ORAL | Status: DC
  Administered 2018-08-11 – 2018-08-14 (×2): 100 mg via ORAL
  Filled 2018-08-10 (×3): qty 1

## 2018-08-10 NOTE — ED Notes (Signed)
ED TO INPATIENT HANDOFF REPORT  ED Nurse Name and Phone #: Davene Costain [phone]  S Name/Age/Gender Mark Li 63 y.o. male Room/Bed: 026C/026C  Code Status   Code Status: Not on file  Home/SNF/Other Home Patient oriented to: self, place, time and situation Is this baseline? Yes   Triage Complete: Triage complete  Chief Complaint [place]  Triage Note PT arrives without a mask on for eval of cough, fever, rash, weakness. Mask provided to pt.   Pt from home for evaluation of fever, productive cough, chills, night sweats, rash, and sob since Wednesday, hx htn; denies sick contacts; endorses chigger bite 3 weeks ago to R scrotum; began antibiotics today as prescribed by provider at [facility] after evaluation of same   Allergies Allergies  Allergen Reactions  . Amoxicillin   . Penicillins   . Zithromax [Azithromycin Dihydrate]     Level of Care/Admitting Diagnosis ED Disposition    ED Disposition Condition [facility] Area: [place] [[id number]]  Level of Care: Telemetry [5]  Covid Evaluation: Confirmed COVID Positive  Isolation Risk Level: Low Risk/Droplet (Less than 4L [place] supplementation)  Diagnosis: Acute respiratory disease due to COVID-19 virus [[id number]]  Admitting Physician: Karmen Bongo [[id number]]  Attending Physician: Karmen Bongo [[id number]]  Estimated length of stay: 3 - 4 days  Certification:: I certify this patient will need inpatient services for at least 2 midnights  PT Class (Do Not Modify): Inpatient [101]  PT Acc Code (Do Not Modify): Private [1]       B Medical/Surgery History Past Medical History:  Diagnosis Date  . Hypertension   . Sleep disturbance 03/20/2013   Past Surgical History:  Procedure Laterality Date  . APPENDECTOMY    . FRACTURE SURGERY       A IV Location/Drains/Wounds Patient Lines/Drains/Airways Status   Active Line/Drains/Airways    Name:   Placement date:   Placement time:   Site:   Days:    Peripheral IV 08/10/18 Right Antecubital   08/10/18    1117    Antecubital   less than 1   Peripheral IV 08/10/18 Left Antecubital   08/10/18    1133    Antecubital   less than 1          Intake/Output Last 24 hours  Intake/Output Summary (Last 24 hours) at 08/10/2018 1356 Last data filed at 08/10/2018 1312 Gross per 24 hour  Intake 99.62 ml  Output -  Net 99.62 ml    Labs/Imaging Results for orders placed or performed during the hospital encounter of 08/10/18 (from the past 48 hour(s))  SARS Coronavirus 2     Status: Abnormal   Collection Time: 08/10/18 10:53 AM  Result Value Ref Range   SARS Coronavirus 2 DETECTED (A) NOT DETECTED    Comment: CRITICAL RESULT CALLED TO, READ BACK BY AND VERIFIED WITH: C.Mcarthur Rossetti, ED RN AT 1250 ON 08/10/18 BY C. JESSUP, MLT. (NOTE) SARS-CoV-2 target nucleic acids are DETECTED. The SARS-CoV-2 RNA is generally detectable in upper and lower respiratory specimens during the acute phase of infection. Positive results are indicative of active infection with SARS-CoV-2. Clinical  correlation with patient history and other diagnostic information is necessary to determine patient infection status. Positive results do  not rule out bacterial infection or co-infection with other viruses. The expected result is Not Detected. Fact Sheet for Patients: http://www.biofiredefense.com/wp-content/uploads/2020/03/BIOFIRE-COVID -19-patients.pdf Fact Sheet for Healthcare Providers: http://www.biofiredefense.com/wp-content/uploads/2020/03/BIOFIRE-COVID -19-hcp.pdf This test is not yet approved or cleared by the [place]  FDA and  has been authorized for detection and/or diagnosis of SARS-C oV-2 by FDA under an Emergency Use Authorization (EUA).  This EUA will remain in effect (meaning this test can be used) for the duration of  the COVID-19 declaration under Section 564(b)(1) of the Act, 21 U.S.C. section [phone] 3(b)(1), unless the authorization  is terminated or revoked sooner. Performed at [facility], [place] [address], [place], [place] [address]   Lactic acid, plasma     Status: None   Collection Time: 08/10/18 11:19 AM  Result Value Ref Range   Lactic Acid, Venous 1.5 0.5 - 1.9 mmol/L    Comment: Performed at [facility] [address] [place], [place] [address]  CBC WITH DIFFERENTIAL     Status: Abnormal   Collection Time: 08/10/18 11:19 AM  Result Value Ref Range   WBC 3.6 (L) 4.0 - 10.5 K/uL   RBC 5.56 4.22 - 5.81 MIL/uL   Hemoglobin 17.3 (H) 13.0 - 17.0 g/dL   HCT 52.3 (H) 39.0 - 52.0 %   MCV 94.1 80.0 - 100.0 fL   MCH 31.1 26.0 - 34.0 pg   MCHC 33.1 30.0 - 36.0 g/dL   RDW 12.1 11.5 - 15.5 %   Platelets 127 (L) 150 - 400 K/uL    Comment: REPEATED TO VERIFY SPECIMEN CHECKED FOR CLOTS    nRBC 0.0 0.0 - 0.2 %   Neutrophils Relative % 47 %   Neutro Abs 1.7 1.7 - 7.7 K/uL   Lymphocytes Relative 38 %   Lymphs Abs 1.4 0.7 - 4.0 K/uL   Monocytes Relative 12 %   Monocytes Absolute 0.4 0.1 - 1.0 K/uL   Eosinophils Relative 1 %   Eosinophils Absolute 0.0 0.0 - 0.5 K/uL   Basophils Relative 1 %   Basophils Absolute 0.0 0.0 - 0.1 K/uL   Immature Granulocytes 1 %   Abs Immature Granulocytes 0.05 0.00 - 0.07 K/uL    Comment: Performed at [facility], [place] [address] [place], [place] [address]  Comprehensive metabolic panel     Status: Abnormal   Collection Time: 08/10/18 11:19 AM  Result Value Ref Range   Sodium 136 135 - 145 mmol/L   Potassium 3.9 3.5 - 5.1 mmol/L   Chloride 102 98 - 111 mmol/L   CO2 23 22 - 32 mmol/L   Glucose, Bld 91 70 - 99 mg/dL   BUN 14 8 - 23 mg/dL   Creatinine, Ser 1.11 0.61 - 1.24 mg/dL   Calcium 8.6 (L) 8.9 - 10.3 mg/dL   Total Protein 7.0 6.5 - 8.1 g/dL   Albumin 3.6 3.5 - 5.0 g/dL   AST 25 15 - 41 U/L   ALT 27 0 - 44 U/L   Alkaline Phosphatase 56 38 - 126 U/L   Total Bilirubin 0.7 0.3 - 1.2 mg/dL   GFR calc non Af Amer >60 >60 mL/min   GFR calc Af Amer >60 >60  mL/min   Anion gap 11 5 - 15    Comment: Performed at [facility] [address] [place], [place] [address]  D-dimer, quantitative     Status: None   Collection Time: 08/10/18 11:19 AM  Result Value Ref Range   D-Dimer, Quant 0.50 0.00 - 0.50 ug/mL-FEU    Comment: (NOTE) At the manufacturer cut-off of 0.50 ug/mL FEU, this assay has been documented to exclude PE with a sensitivity and negative predictive value of 97 to 99%.  At this time, this assay has not been approved by the FDA to exclude DVT/VTE. Results should be correlated with clinical presentation. Performed at [facility], [place] [address], [place], [place] [address]   Procalcitonin     Status: None   Collection Time: 08/10/18 11:19 AM  Result Value Ref Range   Procalcitonin <0.10 ng/mL    Comment:        Interpretation: PCT (Procalcitonin) <= 0.5 ng/mL: Systemic infection (sepsis) is not likely. Local bacterial infection is possible. (NOTE)       Sepsis PCT Algorithm           Lower Respiratory Tract                                      Infection PCT Algorithm    ----------------------------     ----------------------------         PCT < 0.25 ng/mL                PCT < 0.10 ng/mL         Strongly encourage             Strongly discourage   discontinuation of antibiotics    initiation of antibiotics    ----------------------------     -----------------------------       PCT 0.25 - 0.50 ng/mL            PCT 0.10 - 0.25 ng/mL               OR       >80% decrease in PCT            Discourage initiation of                                            antibiotics      Encourage discontinuation           of antibiotics    ----------------------------     -----------------------------         PCT >= 0.50 ng/mL              PCT 0.26 - 0.50 ng/mL               AND        <80% decrease in PCT             Encourage initiation of                                             antibiotics       Encourage  continuation           of antibiotics    ----------------------------     -----------------------------        PCT >= 0.50 ng/mL                  PCT > 0.50 ng/mL               AND         increase in PCT                  Strongly encourage  initiation of antibiotics    Strongly encourage escalation           of antibiotics                                     -----------------------------                                           PCT <= 0.25 ng/mL                                                 OR                                        > 80% decrease in PCT                                     Discontinue / Do not initiate                                             antibiotics Performed at [facility], [address] [place], [place] [address]   Lactate dehydrogenase     Status: None   Collection Time: 08/10/18 11:19 AM  Result Value Ref Range   LDH 189 98 - 192 U/L    Comment: Performed at [facility], [place] [address], [place], [place] [address]  Ferritin     Status: None   Collection Time: 08/10/18 11:19 AM  Result Value Ref Range   Ferritin 262 24 - 336 ng/mL    Comment: Performed at [facility] [address], [place], [place] [address]  Triglycerides     Status: None   Collection Time: 08/10/18 11:19 AM  Result Value Ref Range   Triglycerides 98 <150 mg/dL    Comment: Performed at [facility] [address] [place], [place] [address]  Fibrinogen     Status: Abnormal   Collection Time: 08/10/18 11:19 AM  Result Value Ref Range   Fibrinogen 623 (H) 210 - 475 mg/dL    Comment: Performed at [facility] [address], [place], [place] [address]  C-reactive protein     Status: Abnormal   Collection Time: 08/10/18 11:19 AM  Result Value Ref Range   CRP 5.8 (H) <1.0 mg/dL    Comment: Performed at [facility] [address], [place],  [address]   Dg Chest Port 1 View  Result Date:  08/10/2018 CLINICAL DATA:  63 year old male with cough and fever EXAM: PORTABLE CHEST 1 VIEW COMPARISON:  03/27/2012 FINDINGS: Cardiomediastinal silhouette unchanged in size and contour. Low lung volumes accentuating the interstitium. Patchy opacity in the mid right lung and at the left lung base. No pleural effusion or pneumothorax. No displaced fracture. No interlobular septal thickening or central vascular congestion. IMPRESSION: Low lung volumes with patchy  opacities in the right mid lung and left lung base, potentially developing infection versus atelectasis. Electronically Signed   By: Corrie Mckusick D.O.   On: 08/10/2018 12:21    Pending Labs Unresulted Labs (From admission, onward)    Start     Ordered   08/10/18 1058  Rocky mtn spotted fvr abs pnl(IgG+IgM)  Once,   STAT     08/10/18 1057   08/10/18 1057  Lactic acid, plasma  Now then every 2 hours,   STAT     08/10/18 1056   08/10/18 1057  Blood Culture (routine x 2)  BLOOD CULTURE X 2,   STAT     08/10/18 1056          Vitals/Pain Today's Vitals   08/10/18 1314 08/10/18 1315 08/10/18 1330 08/10/18 1345  BP:  120/77 122/80 119/77  Pulse:  71 70 62  Resp:  17 (!) 23 (!) 24  Temp:      TempSrc:      SpO2:  96% 96% 95%  Weight:      Height:      PainSc: 0-No pain       Isolation Precautions Droplet and Contact precautions  Medications Medications  vancomycin (VANCOCIN) 2,000 mg in sodium chloride 0.9 % 500 mL IVPB (2,000 mg Intravenous New Bag/Given 08/10/18 1233)  doxycycline (VIBRAMYCIN) 100 mg in sodium chloride 0.9 % 250 mL IVPB (100 mg Intravenous New Bag/Given 08/10/18 1229)  acetaminophen (TYLENOL) tablet 650 mg (650 mg Oral Given 08/10/18 1132)  ceFEPIme (MAXIPIME) 2 g in sodium chloride 0.9 % 100 mL IVPB (0 g Intravenous Stopped 08/10/18 1312)    Mobility walks     Focused Assessments Pulmonary Assessment Handoff:  Lung sounds: Bilateral Breath Sounds: Clear, Diminished L Breath Sounds: Diminished, Clear R  Breath Sounds: Diminished, Clear O2 Device: Room Air        R Recommendations: See Admitting Provider Note  Report given to:   Additional Notes:

## 2018-08-10 NOTE — ED Triage Notes (Signed)
Pt from home for evaluation of fever, productive cough, chills, night sweats, rash, and sob since Wednesday, hx htn; denies sick contacts; endorses chigger bite 3 weeks ago to R scrotum; began antibiotics today as prescribed by provider at [facility] after evaluation of same

## 2018-08-10 NOTE — ED Provider Notes (Signed)
[place] EMERGENCY DEPARTMENT Provider Note   CSN: [id number] Arrival date & time: 08/10/18  1036     History   Chief Complaint Chief Complaint  Patient presents with  . Fever  . Cough    HPI Mark Li is a 63 y.o. male.     HPI  63 year old man history of hypertension presents today complaining of cough and fever.  He states that he began getting sick on Wednesday.  He was on a family vacation and came home due to this.  He has noted some rash across his upper chest and shoulders.  He was seen by [facility] yesterday and had a prednisone pack and doxycycline prescribed.  He took the first dose this morning.  He endorses productive cough and some dyspnea.  He has a history of smoking in the past but denies any current smoking or COPD.  He has a past medical history of hypertension and takes daily lisinopril.  He reports taking his medications as prescribed.  Primary care is [facility].  He was told to be tested for COVID today.  He was attempting to find a test when he was directed to come to the ED due to his symptoms.  He reports that he may have had a tick bite but he thinks that it was a chigger in the groin area several weeks ago.  He did not see what bit him.  There was some itchy area.  Past Medical History:  Diagnosis Date  . Sleep disturbance 03/20/2013    Patient Active Problem List   Diagnosis Date Noted  . Sleep disturbance 03/20/2013    Past Surgical History:  Procedure Laterality Date  . APPENDECTOMY          Home Medications    Prior to Admission medications   Medication Sig Start Date End Date Taking? Authorizing Provider  zolpidem (AMBIEN) 5 MG tablet Take 1-2 tablets (5-10 mg total) by mouth at bedtime as needed for sleep. 03/17/13   Wendie Agreste, MD    Family History Family History  Problem Relation Age of Onset  . COPD Mother   . Heart disease Father     Social History Social History   Tobacco  Use  . Smoking status: Former Smoker  Substance Use Topics  . Alcohol use: Yes  . Drug use: No     Allergies   Amoxicillin, Penicillins, and Zithromax [azithromycin dihydrate]   Review of Systems Review of Systems  Constitutional: Positive for activity change and fatigue.  HENT: Positive for congestion and rhinorrhea.   Eyes: Positive for redness.  Respiratory: Positive for cough and shortness of breath.   Cardiovascular: Negative.   Gastrointestinal: Negative.  Negative for diarrhea, nausea and vomiting.  Endocrine: Negative.   Genitourinary: Negative.   Musculoskeletal: Negative.   Allergic/Immunologic: Negative.   Neurological: Negative.   Hematological: Negative.   Psychiatric/Behavioral: Negative.   All other systems reviewed and are negative.    Physical Exam Updated Vital Signs Temp (!) 101.4 F (38.6 C) (Rectal)   Physical Exam Vitals signs and nursing note reviewed.  Constitutional:      General: He is not in acute distress.    Appearance: Normal appearance. He is obese. He is ill-appearing.  HENT:     Head: Normocephalic.     Right Ear: External ear normal.     Left Ear: External ear normal.     Nose: Nose normal.  Eyes:     Conjunctiva/sclera:  Right eye: Right conjunctiva is injected.     Left eye: Left conjunctiva is injected.  Neck:     Musculoskeletal: Normal range of motion.  Pulmonary:     Effort: Pulmonary effort is normal.     Breath sounds: Normal breath sounds.  Abdominal:     General: There is distension.     Palpations: Abdomen is soft. There is no mass.     Tenderness: There is no abdominal tenderness.  Genitourinary:    Penis: Normal.   Musculoskeletal: Normal range of motion.  Skin:    General: Skin is warm and dry.     Findings: Rash present.     Comments: Maculopapular rash across upper chest to shoulders  Neurological:     General: No focal deficit present.     Mental Status: He is alert and oriented to person, place,  and time.     Cranial Nerves: No cranial nerve deficit.     Motor: No weakness.     Gait: Gait normal.  Psychiatric:        Mood and Affect: Mood normal.      ED Treatments / Results  Labs (all labs ordered are listed, but only abnormal results are displayed) Labs Reviewed  SARS CORONAVIRUS 2 (HOSPITAL ORDER, [facility])  CULTURE, BLOOD (ROUTINE X 2)  CULTURE, BLOOD (ROUTINE X 2)  LACTIC ACID, PLASMA  LACTIC ACID, PLASMA  CBC WITH DIFFERENTIAL/PLATELET  COMPREHENSIVE METABOLIC PANEL  D-DIMER, QUANTITATIVE (NOT AT [facility])  PROCALCITONIN  LACTATE DEHYDROGENASE  FERRITIN  TRIGLYCERIDES  FIBRINOGEN  C-REACTIVE PROTEIN  ROCKY MTN SPOTTED FVR ABS PNL(IGG+IGM)    EKG EKG Interpretation  Date/Time:  Saturday August 10 2018 10:55:46 EDT Ventricular Rate:  71 PR Interval:    QRS Duration: 97 QT Interval:  364 QTC Calculation: 396 R Axis:   -26 Text Interpretation:  Sinus rhythm Borderline left axis deviation Confirmed by Pattricia Boss [phone]) on 08/10/2018 1:04:39 PM   Radiology Dg Chest Port 1 View  Result Date: 08/10/2018 CLINICAL DATA:  63 year old male with cough and fever EXAM: PORTABLE CHEST 1 VIEW COMPARISON:  03/27/2012 FINDINGS: Cardiomediastinal silhouette unchanged in size and contour. Low lung volumes accentuating the interstitium. Patchy opacity in the mid right lung and at the left lung base. No pleural effusion or pneumothorax. No displaced fracture. No interlobular septal thickening or central vascular congestion. IMPRESSION: Low lung volumes with patchy opacities in the right mid lung and left lung base, potentially developing infection versus atelectasis. Electronically Signed   By: Corrie Mckusick D.O.   On: 08/10/2018 12:21    Procedures Procedures (including critical care time)  Medications Ordered in ED Medications - No data to display   Initial Impression / Assessment and Plan / ED Course  I have reviewed the triage vital  signs and the nursing notes.  Pertinent labs & imaging results that were available during my care of the patient were reviewed by me and considered in my medical decision making (see chart for details).       63 year old male presents today with cough, fever, and rash.  Patient evaluated here with labs, chest x-Jesselyn Rask.  Treated with IV broad-spectrum antibiotics including coverage for [facility] spotted fever due to recent bite and rash.  However, COVID test is positive.  Patient with borderline oxygen saturations 93% with some tachypnea, oxygen ordered.  Plan admission for further treatment and evaluation Discussed with Dr. Lorin Mercy who will see for admission Final Clinical Impressions(s) / ED  Diagnoses   Final diagnoses:  [id number]  Hypoxia    ED Discharge Orders    None       Pattricia Boss, MD 08/10/18 1318

## 2018-08-10 NOTE — ED Notes (Signed)
Dr. Jeanell Sparrow aware of positive covid result

## 2018-08-10 NOTE — Plan of Care (Signed)

## 2018-08-10 NOTE — Progress Notes (Signed)
Pt arrived to unit at approximately 1815 this evening, He ambulated from stretcher to bed with little assistance from EMT.  Pt was placed in bed, with cardiac monitor place on him. Pt resting comfortable now in bed.  He called his wife on the phone and this RN advised him of his arrival to the unit, advised of update policy, and that the Pt was doing well, and settling into his room

## 2018-08-10 NOTE — ED Triage Notes (Addendum)
PT arrives without a mask on for eval of cough, fever, rash, weakness. Mask provided to pt.

## 2018-08-10 NOTE — ED Notes (Signed)
Report given to [place] [facility] [place]; [place] estimates 1.5 hour ETA

## 2018-08-10 NOTE — ED Notes (Signed)
Pt on phone with family  

## 2018-08-10 NOTE — ED Notes (Signed)
O2 sats noted to be in low 90's; pt placed on 2L O2 via [place]

## 2018-08-10 NOTE — ED Notes (Signed)
Pt given food and beverage 

## 2018-08-10 NOTE — ED Notes (Signed)
Spoke to [employer], pt scheduled to be transported; [employer] states she will call this RN when ambulance en route

## 2018-08-10 NOTE — ED Notes (Signed)
Report called to Vinnie Level, Therapist, sports at [facility]

## 2018-08-10 NOTE — Progress Notes (Signed)
Pharmacy Brief Note  O:  ALT: WNL CXR: possible PNA SpO2: 97 % on 2L [place]  A/P: Patient meets requirements for remdesivir. Patient was given initial 200 mg dose at [facility]. Will order remdesivir 100 mg daily x 4 days from tomorrow.   Ulice Dash, PharmD, BCPS Clinical Pharmacist  08/10/18 9:04 PM

## 2018-08-10 NOTE — H&P (Signed)
History and Physical    Mark Li PNT:[id number] DOB: Mar 28, 1955 DOA: 08/10/2018  PCP: Daisy Lazar Consultants:  None Patient coming from:  Home - lives with ; [facility]:   Chief Complaint: Fever, cough  HPI: Mark Li is a 63 y.o. male with medical history significant of HTN presenting with fever, cough.  Patient has mostly stayed inside recently due to his vacation in [place] with family.  He left last weekend and developed onset of symptoms on Wednesday - fatigue, low-grade fever, night sweats, severe cough, and SOB.  They left the vacation early due to concern that he could have COVID.  He also has a painful rash along his shoulders, chest, and back.  He called his PCP yesterday for a televisit, but they were driving home from [place] at that time.  He was encouraged to go in for evaluation today and was prescribed PO Doxy and a Medrol dose pack.  He went in for evaluation today at [facility] and was told to go to the ER for further evaluation and treatment.    ED Course:  Family vacation with Lestine Mount grandbaby, left vacation early because he felt bad.  Has COVID.  Mild tachypnea, 93% and being put on O2.  Has a rash on upper chest and shoulders - concern for tick bite.  Given broad-spectrum antibiotics, likely does not need to be continued.  Review of Systems: As per HPI; otherwise review of systems reviewed and negative.   Ambulatory Status: Ambulates without assistance  Past Medical History:  Diagnosis Date  . Hypertension   . Sleep disturbance 03/20/2013    Past Surgical History:  Procedure Laterality Date  . APPENDECTOMY    . FRACTURE SURGERY      Social History   Socioeconomic History  . Marital status: Married    Spouse name: Not on file  . Number of children: Not on file  . Years of education: Not on file  . Highest education level: Not on file  Occupational History  . Not on file  Social Needs  . Financial resource strain: Not on file  . Food  insecurity    Worry: Not on file    Inability: Not on file  . Transportation needs    Medical: Not on file    Non-medical: Not on file  Tobacco Use  . Smoking status: Former Research scientist (life sciences)  . Smokeless tobacco: Never Used  Substance and Sexual Activity  . Alcohol use: Yes    Comment: 10 drinks (beer, wine, liquor) per week  . Drug use: No  . Sexual activity: Not on file  Lifestyle  . Physical activity    Days per week: Not on file    Minutes per session: Not on file  . Stress: Not on file  Relationships  . Social Herbalist on phone: Not on file    Gets together: Not on file    Attends religious service: Not on file    Active member of club or organization: Not on file    Attends meetings of clubs or organizations: Not on file    Relationship status: Not on file  . Intimate partner violence    Fear of current or ex partner: Not on file    Emotionally abused: Not on file    Physically abused: Not on file    Forced sexual activity: Not on file  Other Topics Concern  . Not on file  Social History Narrative  . Not on  file    Allergies  Allergen Reactions  . Sm Antibiotic Plus Pain Relief  [Neomycin-Polymyxin-Pramoxine] Other (See Comments)    Patient does not tolerate Antibiotics well  . Amoxicillin   . Penicillins   . Zithromax [Azithromycin Dihydrate]     Family History  Problem Relation Age of Onset  . COPD Mother   . Heart disease Father     Prior to Admission medications   Medication Sig Start Date End Date Taking? Authorizing Provider  zolpidem (AMBIEN) 5 MG tablet Take 1-2 tablets (5-10 mg total) by mouth at bedtime as needed for sleep. 03/17/13   Wendie Agreste, MD    Physical Exam: Vitals:   08/10/18 1312 08/10/18 1315 08/10/18 1330 08/10/18 1345  BP:  120/77 122/80 119/77  Pulse:  71 70 62  Resp:  17 (!) 23 (!) 24  Temp: 99.6 F (37.6 C)     TempSrc: Oral     SpO2:  96% 96% 95%  Weight:      Height:         . General:  Appears calm  and comfortable and is NAD but is mildly diaphoretic and ill-appearing . Eyes:  PERRL, EOMI, normal lids, iris . ENT:  grossly normal hearing, lips & tongue, mmm; appropriate dentition . Neck:  no LAD, masses or thyromegaly . Cardiovascular:  RRR, no m/r/g. No LE edema.  Marland Kitchen Respiratory:   CTA bilaterally with no wheezes/rales/rhonchi.  Mildly increased respiratory effort. . Abdomen:  soft, NT, ND, NABS . Back:   normal alignment, no CVAT . Skin:  Macular rash along left anterior shoulder extending across midline to right shoulder and upper back bilaterally . Musculoskeletal:  grossly normal tone BUE/BLE, good ROM, no bony abnormality . Psychiatric:  blunted mood and affect, speech fluent and appropriate, AOx3 . Neurologic:  CN 2-12 grossly intact, moves all extremities in coordinated fashion, sensation intact    Radiological Exams on Admission: Dg Chest Port 1 View  Result Date: 08/10/2018 CLINICAL DATA:  63 year old male with cough and fever EXAM: PORTABLE CHEST 1 VIEW COMPARISON:  03/27/2012 FINDINGS: Cardiomediastinal silhouette unchanged in size and contour. Low lung volumes accentuating the interstitium. Patchy opacity in the mid right lung and at the left lung base. No pleural effusion or pneumothorax. No displaced fracture. No interlobular septal thickening or central vascular congestion. IMPRESSION: Low lung volumes with patchy opacities in the right mid lung and left lung base, potentially developing infection versus atelectasis. Electronically Signed   By: Corrie Mckusick D.O.   On: 08/10/2018 12:21    EKG: Independently reviewed.  NSR with rate 71; nonspecific ST changes with no evidence of acute ischemia   Labs on Admission: I have personally reviewed the available labs and imaging studies at the time of the admission.  Pertinent labs:   CMP unremarkable LDH 189 Ferritin 262 CRP 5.8 Lactate 1.5 WBC 3.6 Hgb 17.3 Platelets 127 D-dimer 0.50 Fibrinogen 623 Blood cultures  pending COVID POSITIVE  Assessment/Plan Principal Problem:   Acute respiratory disease due to COVID-19 virus Active Problems:   Essential hypertension   Acute respiratory failure with hypoxia -Patient with presenting with SOB, cough, and fever -No known COVID contacts but traveled to [place] last weekend prior to onset of illness -He does not have a usual home O2 requirement and is currently requiring 2L [place] O2 -The patient has comorbidities which may increase the risk for ARDS/MODS including:  HTN, obesity (BMI 36) -Pertinent labs concerning for COVID include leukopenia; low procalcitonin; elevated  CRP (but not >7); increased fibrinogen -CXR with multifocal opacities which may be c/w COVID -Will not treat with broad-spectrum antibiotics given procalcitonin <0.1; he did receive an Rx from his PCP for doxy and he took 1 dose this AM and he received Doxy/Vanc in the ER but will not continue  -Will admit to [facility] for further evaluation, close monitoring, and treatment -Monitor on telemetry x at least 24 hours -At this time, will attempt to avoid use of aerosolized medications and use HFAs instead -Will check daily labs including BMP with Mag, Phos; LFTs; CBC with differential; CRP (q12h); ferritin; fibrinogen; D-dimer (q12h) -Will order steroids (1 mg/kg divided BID) and Remdesivir (pharmacy consult) given +COVID test, +CXR, and hypoxia <94% on room air -If the patient shows clinical deterioration, consider transfer to ICU with PCCM consultation -If the patient is hypoxic or on >3L oxygen from baseline or CRP >7, considerTocilizumab 8 mg/kg x1 IF + COVID test; O2 sats <88% on room air; and patient is at high risk for intubation.  Will defer to [facility] doctors regarding this medication since it is being used off label. -Will attempt to maintain euvolemia to a net negative fluid status -Will ask the patient to maintain an awake prone position for 16+ hours a day, if possible, with a minimum of 2-3 hours  at a time -With D-dimer <5, will use standard-dosed Lovenox for DVT prevention -Patient was seen wearing full PPE including: gown, gloves, head cover, N95, and face shield; donning and doffing was in compliance with current standards.  HTN -Continue Lisinopril    DVT prophylaxis:  Lovenox  Code Status:  Full - confirmed with patient Family Communication: None present; I spoke with the patient's wife by telephone while in the room with the patient Disposition Plan:  Home once clinically improved Consults called: None  Admission status: Admit - It is my clinical opinion that admission to INPATIENT is reasonable and necessary because of the expectation that this patient will require hospital care that crosses at least 2 midnights to treat this condition based on the medical complexity of the problems presented.  Given the aforementioned information, the predictability of an adverse outcome is felt to be significant.     Karmen Bongo MD [facility]   How to contact the [facility] Attending or Consulting provider [place] or covering provider during after hours [place], for this patient?  1. Check the care team in [facility] and look for a) attending/consulting TR[facility] provider listed and b) the [facility] team listed 2. Log into [email or web address] and use [place]'s universal password to access. If you do not have the password, please contact the hospital operator. 3. Locate the [facility] provider you are looking for under [facility] and page to a number that you can be directly reached. 4. If you still have difficulty reaching the provider, please page the [facility] (Director on Call) for the Hospitalists listed on amion for assistance.   08/10/2018, 2:36 PM

## 2018-08-11 DIAGNOSIS — I10 Essential (primary) hypertension: Secondary | ICD-10-CM

## 2018-08-11 DIAGNOSIS — J988 Other specified respiratory disorders: Secondary | ICD-10-CM

## 2018-08-11 LAB — C-REACTIVE PROTEIN
CRP: 5.1 mg/dL — ABNORMAL HIGH (ref <1.0)
CRP: 3.8 mg/dL — ABNORMAL HIGH (ref <1.0)

## 2018-08-11 LAB — CBC WITH DIFFERENTIAL/PLATELET
Abs Immature Granulocytes: 0.05 10*3/uL (ref 0.00–0.07)
Basophils Absolute: 0 10*3/uL (ref 0.0–0.1)
Basophils Relative: 1 %
Eosinophils Absolute: 0 10*3/uL (ref 0.0–0.5)
Eosinophils Relative: 0 %
HCT: 48.6 % (ref 39.0–52.0)
Hemoglobin: 16.5 g/dL (ref 13.0–17.0)
Immature Granulocytes: 2 %
Lymphocytes Relative: 32 %
Lymphs Abs: 0.9 10*3/uL (ref 0.7–4.0)
MCH: 31.7 pg (ref 26.0–34.0)
MCHC: 34 g/dL (ref 30.0–36.0)
MCV: 93.5 fL (ref 80.0–100.0)
Monocytes Absolute: 0.2 10*3/uL (ref 0.1–1.0)
Monocytes Relative: 7 %
Neutro Abs: 1.6 10*3/uL — ABNORMAL LOW (ref 1.7–7.7)
Neutrophils Relative %: 58 %
Platelets: 138 10*3/uL — ABNORMAL LOW (ref 150–400)
RBC: 5.2 MIL/uL (ref 4.22–5.81)
RDW: 12.1 % (ref 11.5–15.5)
WBC: 2.7 10*3/uL — ABNORMAL LOW (ref 4.0–10.5)
nRBC: 0 % (ref 0.0–0.2)

## 2018-08-11 LAB — MAGNESIUM: Magnesium: 2 mg/dL (ref 1.7–2.4)

## 2018-08-11 LAB — COMPREHENSIVE METABOLIC PANEL
ALT: 22 U/L (ref 0–44)
AST: 18 U/L (ref 15–41)
Albumin: 3.4 g/dL — ABNORMAL LOW (ref 3.5–5.0)
Alkaline Phosphatase: 45 U/L (ref 38–126)
Anion gap: 10 (ref 5–15)
BUN: 15 mg/dL (ref 8–23)
CO2: 21 mmol/L — ABNORMAL LOW (ref 22–32)
Calcium: 8.3 mg/dL — ABNORMAL LOW (ref 8.9–10.3)
Chloride: 107 mmol/L (ref 98–111)
Creatinine, Ser: 0.76 mg/dL (ref 0.61–1.24)
GFR calc Af Amer: >60 mL/min (ref >60)
GFR calc non Af Amer: >60 mL/min (ref >60)
Glucose, Bld: 146 mg/dL — ABNORMAL HIGH (ref 70–99)
Potassium: 4.2 mmol/L (ref 3.5–5.1)
Sodium: 138 mmol/L (ref 135–145)
Total Bilirubin: 0.4 mg/dL (ref 0.3–1.2)
Total Protein: 6.7 g/dL (ref 6.5–8.1)

## 2018-08-11 LAB — D-DIMER, QUANTITATIVE
D-Dimer, Quant: 0.51 ug/mL-FEU — ABNORMAL HIGH (ref 0.00–0.50)
D-Dimer, Quant: 0.48 ug/mL-FEU (ref 0.00–0.50)

## 2018-08-11 LAB — HIV ANTIBODY (ROUTINE TESTING W REFLEX): HIV Screen 4th Generation wRfx: NONREACTIVE

## 2018-08-11 LAB — FERRITIN: Ferritin: 216 ng/mL (ref 24–336)

## 2018-08-11 LAB — PHOSPHORUS: Phosphorus: 2.8 mg/dL (ref 2.5–4.6)

## 2018-08-11 LAB — FIBRINOGEN: Fibrinogen: 643 mg/dL — ABNORMAL HIGH (ref 210–475)

## 2018-08-11 MED ORDER — POLYVINYL ALCOHOL 1.4 % OP SOLN
1.0000 [drp] | OPHTHALMIC | Status: DC | PRN
  Administered 2018-08-11 (×2): 1 [drp] via OPHTHALMIC
  Filled 2018-08-11: qty 15

## 2018-08-11 MED ORDER — SIMETHICONE 80 MG PO CHEW
80.0000 mg | CHEWABLE_TABLET | Freq: Four times a day (QID) | ORAL | Status: DC | PRN
  Administered 2018-08-11: 80 mg via ORAL
  Filled 2018-08-11 (×2): qty 1

## 2018-08-11 MED ORDER — HYDROCORTISONE 1 % EX CREA
TOPICAL_CREAM | Freq: Four times a day (QID) | CUTANEOUS | Status: DC
  Administered 2018-08-11 – 2018-08-13 (×7): via TOPICAL
  Administered 2018-08-14: 1 via TOPICAL
  Administered 2018-08-14: 14:00:00 via TOPICAL
  Filled 2018-08-11: qty 28

## 2018-08-11 MED ORDER — HYDROCORTISONE 1 % EX CREA
TOPICAL_CREAM | Freq: Four times a day (QID) | CUTANEOUS | Status: DC
  Filled 2018-08-11: qty 28

## 2018-08-11 MED ORDER — NAPHAZOLINE-PHENIRAMINE 0.025-0.3 % OP SOLN
1.0000 [drp] | Freq: Four times a day (QID) | OPHTHALMIC | Status: DC | PRN
  Filled 2018-08-11: qty 5

## 2018-08-11 MED ORDER — FAMOTIDINE 20 MG PO TABS
20.0000 mg | ORAL_TABLET | Freq: Every day | ORAL | Status: DC
  Administered 2018-08-11 – 2018-08-14 (×4): 20 mg via ORAL
  Filled 2018-08-11 (×4): qty 1

## 2018-08-11 MED ORDER — DIPHENHYDRAMINE HCL 25 MG PO CAPS
25.0000 mg | ORAL_CAPSULE | Freq: Four times a day (QID) | ORAL | Status: DC | PRN
  Administered 2018-08-11: 25 mg via ORAL
  Filled 2018-08-11: qty 1

## 2018-08-11 MED ORDER — LIP MEDEX EX OINT
TOPICAL_OINTMENT | CUTANEOUS | Status: DC | PRN
  Filled 2018-08-11: qty 7

## 2018-08-11 NOTE — Progress Notes (Signed)
PROGRESS NOTE                                                                                                                                                                                                             Patient Demographics:    Mark Li, is a 63 y.o. male, DOB - 12-26-1955, BVQ:[id number]  Outpatient Primary MD for the patient is [name], Mark Moulder, MD    LOS - 1  Chief Complaint  Patient presents with  . Fever  . Cough       Brief Narrative: Patient is a 63 y.o. male with PMHx of hypertension presented with several days history of fever-cough-further evaluation revealed acute hypoxemic respiratory failure secondary to COVID-19 viral pneumonia.  See below for further details.   Subjective:    Mark Li today she feels slightly better.  Afebrile this morning.  Complains of some intermittent burning rash in his upper back/neck area.  Also complains of some dry eyes.   Assessment  & Plan :  Acute Hypoxic Resp Failure due to Covid 19 Viral pneumonia: Seems to be slowly improving-not in any distress-has now been transitioned to room air.  Continues to have some coughing spells.  Plan is to continue with 5-day course of Remdesivir, and 3-day course of Solu-Medrol.  Is improving-no indication to escalate care with Actemra or convalescent plasma.  COVID-19 Labs:  Recent Labs    08/10/18 1119 08/10/18 1712 08/11/18 0450 08/11/18 0455  DDIMER 0.50 0.45 0.51*  --   FERRITIN 262  --   --  216  LDH 189  --   --   --   CRP 5.8* 4.9*  --  5.1*    Lab Results  Component Value Date   SARSCOV2NAA DETECTED (A) 08/10/2018     COVID-19 Medications: 6/13>> Remdesivir  Mild erythematous rash in the upper back: Not sure if this is secondary to COVID-19-or photosensitive rash secondary to being on doxycycline.  Supportive care at this point-no evidence of discoloration-continue steroids-add as needed  Benadryl and scheduled Pepcid.  Will monitor for now.  Hypertension: Controlled-continue lisinopril    ABG: No results found for: PHART, PCO2ART, PO2ART, HCO3, TCO2, ACIDBASEDEF, O2SAT  Condition -Stable  Family Communication  :  Spouse/Daughter/Son updated over the phone  Code Status :  Full Code/Partial/DNR  Diet :  Diet Order  Diet regular Room service appropriate? Yes; Fluid consistency: Thin  Diet effective now               Disposition Plan  :  Remain inpatient  Consults  : None  Procedures  : None  DVT Prophylaxis  :  Lovenox  Lab Results  Component Value Date   PLT 138 (L) 08/11/2018    Inpatient Medications  Scheduled Meds: . docusate sodium  100 mg Oral BID  . enoxaparin (LOVENOX) injection  40 mg Subcutaneous Q24H  . famotidine  20 mg Oral Daily  . hydrocortisone cream   Topical QID  . lisinopril  10 mg Oral Daily  . methylPREDNISolone (SOLU-MEDROL) injection  60 mg Intravenous Q12H   Continuous Infusions: . remdesivir 100 mg in NS 250 mL     PRN Meds:.acetaminophen, bisacodyl, diphenhydrAMINE, lip balm, naphazoline-pheniramine, ondansetron **OR** ondansetron (ZOFRAN) IV, oxyCODONE, polyethylene glycol, polyvinyl alcohol  Antibiotics  :    Anti-infectives (From admission, onward)   Start     Dose/Rate Route Frequency Ordered Stop   08/11/18 1600  remdesivir 100 mg in sodium chloride 0.9 % 230 mL IVPB     100 mg 500 mL/hr over 30 Minutes Intravenous Every 24 hours 08/10/18 2102 08/15/18 1559   08/10/18 1500  remdesivir 200 mg in sodium chloride 0.9 % 210 mL IVPB     200 mg 500 mL/hr over 30 Minutes Intravenous Once 08/10/18 1418 08/10/18 1714   08/10/18 1300  vancomycin (VANCOCIN) 2,000 mg in sodium chloride 0.9 % 500 mL IVPB     2,000 mg 250 mL/hr over 120 Minutes Intravenous  Once 08/10/18 1146 08/10/18 1435   08/10/18 1230  ceFEPIme (MAXIPIME) 2 g in sodium chloride 0.9 % 100 mL IVPB     2 g 200 mL/hr over 30 Minutes  Intravenous  Once 08/10/18 1146 08/10/18 1312   08/10/18 1230  doxycycline (VIBRAMYCIN) 100 mg in sodium chloride 0.9 % 250 mL IVPB  Status:  Discontinued     100 mg 125 mL/hr over 120 Minutes Intravenous Every 12 hours 08/10/18 1146 08/10/18 1414       Time Spent in minutes  25   Mark Li M.D on 08/11/2018 at 9:33 AM  To page go to [email or web address] - use universal password  [facility] -  Office  [phone]   Admit date - 08/10/2018    1    Objective:   Vitals:   08/10/18 2311 08/11/18 0135 08/11/18 0332 08/11/18 0830  BP: 122/87  115/86 (!) 136/92  Pulse: 62  (!) 58   Resp: 11  14 20   Temp: 98.3 F (36.8 C) 97.7 F (36.5 C) 98.1 F (36.7 C) 99.1 F (37.3 C)  TempSrc: Oral Oral Oral Oral  SpO2: 93%  94%   Weight:      Height:        Wt Readings from Last 3 Encounters:  08/10/18 115.7 kg  03/20/13 114.3 kg  03/17/13 114.8 kg     Intake/Output Summary (Last 24 hours) at 08/11/2018 0933 Last data filed at 08/11/2018 0800 Gross per 24 hour  Intake 2029.62 ml  Output -  Net 2029.62 ml     Physical Exam Gen Exam:Alert awake-not in any distress HEENT:atraumatic, normocephalic Chest: B/L clear to auscultation anteriorly CVS:S1S2 regular Abdomen:soft non tender, non distended Extremities:no edema Neurology: Non focal Skin: no rash   Data Review:    CBC Recent Labs  Lab 08/10/18 1119 08/11/18 0450  WBC 3.6* 2.7*  HGB 17.3*  16.5  HCT 52.3* 48.6  PLT 127* 138*  MCV 94.1 93.5  MCH 31.1 31.7  MCHC 33.1 34.0  RDW 12.1 12.1  LYMPHSABS 1.4 0.9  MONOABS 0.4 0.2  EOSABS 0.0 0.0  BASOSABS 0.0 0.0    Chemistries  Recent Labs  Lab 08/10/18 1119 08/11/18 0450  NA 136 138  K 3.9 4.2  CL 102 107  CO2 23 21*  GLUCOSE 91 146*  BUN 14 15  CREATININE 1.11 0.76  CALCIUM 8.6* 8.3*  MG  --  2.0  AST 25 18  ALT 27 22  ALKPHOS 56 45  BILITOT 0.7 0.4    ------------------------------------------------------------------------------------------------------------------ Recent Labs    08/10/18 1119  TRIG 98    No results found for: HGBA1C ------------------------------------------------------------------------------------------------------------------ No results for input(s): TSH, T4TOTAL, T3FREE, THYROIDAB in the last 72 hours.  Invalid input(s): FREET3 ------------------------------------------------------------------------------------------------------------------ Recent Labs    08/10/18 1119 08/11/18 0455  FERRITIN 262 216    Coagulation profile No results for input(s): INR, PROTIME in the last 168 hours.  Recent Labs    08/10/18 1712 08/11/18 0450  DDIMER 0.45 0.51*    Cardiac Enzymes No results for input(s): CKMB, TROPONINI, MYOGLOBIN in the last 168 hours.  Invalid input(s): CK ------------------------------------------------------------------------------------------------------------------ No results found for: BNP  Micro Results Recent Results (from the past 240 hour(s))  SARS Coronavirus 2     Status: Abnormal   Collection Time: 08/10/18 10:53 AM  Result Value Ref Range Status   SARS Coronavirus 2 DETECTED (A) NOT DETECTED Final    Comment: CRITICAL RESULT CALLED TO, READ BACK BY AND VERIFIED WITH: C.Mcarthur Rossetti, ED RN AT 1250 ON 08/10/18 BY C. JESSUP, MLT. (NOTE) SARS-CoV-2 target nucleic acids are DETECTED. The SARS-CoV-2 RNA is generally detectable in upper and lower respiratory specimens during the acute phase of infection. Positive results are indicative of active infection with SARS-CoV-2. Clinical  correlation with patient history and other diagnostic information is necessary to determine patient infection status. Positive results do  not rule out bacterial infection or co-infection with other viruses. The expected result is Not Detected. Fact Sheet for Patients:  http://www.biofiredefense.com/wp-content/uploads/2020/03/BIOFIRE-COVID -19-patients.pdf Fact Sheet for Healthcare Providers: http://www.biofiredefense.com/wp-content/uploads/2020/03/BIOFIRE-COVID -19-hcp.pdf This test is not yet approved or cleared by the [place] and  has been authorized for detection and/or diagnosis of SARS-C oV-2 by FDA under an Emergency Use Authorization (EUA).  This EUA will remain in effect (meaning this test can be used) for the duration of  the COVID-19 declaration under Section 564(b)(1) of the Act, 21 U.S.C. section [phone] 3(b)(1), unless the authorization is terminated or revoked sooner. Performed at [facility], [place] [address] [place], [place] [address]   Blood Culture (routine x 2)     Status: None (Preliminary result)   Collection Time: 08/10/18 11:31 AM   Specimen: BLOOD  Result Value Ref Range Status   Specimen Description BLOOD LEFT ANTECUBITAL  Final   Special Requests   Final    BOTTLES DRAWN AEROBIC AND ANAEROBIC Blood Culture results may not be optimal due to an inadequate volume of blood received in culture bottles   Culture   Final    NO GROWTH < 24 HOURS Performed at [facility], [place] [address], [place],  [address]    Report Status PENDING  Incomplete  Blood Culture (routine x 2)     Status: None (Preliminary result)   Collection Time: 08/10/18 12:06 PM   Specimen: BLOOD  Result Value Ref Range Status  Specimen Description BLOOD RIGHT ANTECUBITAL  Final   Special Requests   Final    BOTTLES DRAWN AEROBIC AND ANAEROBIC Blood Culture adequate volume   Culture   Final    NO GROWTH < 24 HOURS Performed at [facility], [address], [place], [place] [address]    Report Status PENDING  Incomplete    Radiology Reports Dg Chest Port 1 View  Result Date: 08/10/2018 CLINICAL DATA:  63 year old male with cough and fever EXAM: PORTABLE CHEST 1 VIEW COMPARISON:  03/27/2012 FINDINGS: Cardiomediastinal  silhouette unchanged in size and contour. Low lung volumes accentuating the interstitium. Patchy opacity in the mid right lung and at the left lung base. No pleural effusion or pneumothorax. No displaced fracture. No interlobular septal thickening or central vascular congestion. IMPRESSION: Low lung volumes with patchy opacities in the right mid lung and left lung base, potentially developing infection versus atelectasis. Electronically Signed   By: Corrie Mckusick D.O.   On: 08/10/2018 12:21

## 2018-08-11 NOTE — Progress Notes (Signed)
Facetime call made to wife so she could communicate with patient. All questions were answered.

## 2018-08-12 LAB — CBC WITH DIFFERENTIAL/PLATELET
Abs Immature Granulocytes: 0.09 10*3/uL — ABNORMAL HIGH (ref 0.00–0.07)
Basophils Absolute: 0 10*3/uL (ref 0.0–0.1)
Basophils Relative: 0 %
Eosinophils Absolute: 0 10*3/uL (ref 0.0–0.5)
Eosinophils Relative: 0 %
HCT: 48.9 % (ref 39.0–52.0)
Hemoglobin: 16.2 g/dL (ref 13.0–17.0)
Immature Granulocytes: 1 %
Lymphocytes Relative: 17 %
Lymphs Abs: 1.3 10*3/uL (ref 0.7–4.0)
MCH: 31 pg (ref 26.0–34.0)
MCHC: 33.1 g/dL (ref 30.0–36.0)
MCV: 93.7 fL (ref 80.0–100.0)
Monocytes Absolute: 0.7 10*3/uL (ref 0.1–1.0)
Monocytes Relative: 10 %
Neutro Abs: 5.4 10*3/uL (ref 1.7–7.7)
Neutrophils Relative %: 72 %
Platelets: 164 10*3/uL (ref 150–400)
RBC: 5.22 MIL/uL (ref 4.22–5.81)
RDW: 12.2 % (ref 11.5–15.5)
WBC: 7.5 10*3/uL (ref 4.0–10.5)
nRBC: 0 % (ref 0.0–0.2)

## 2018-08-12 LAB — COMPREHENSIVE METABOLIC PANEL
ALT: 26 U/L (ref 0–44)
AST: 22 U/L (ref 15–41)
Albumin: 3.3 g/dL — ABNORMAL LOW (ref 3.5–5.0)
Alkaline Phosphatase: 46 U/L (ref 38–126)
Anion gap: 9 (ref 5–15)
BUN: 19 mg/dL (ref 8–23)
CO2: 23 mmol/L (ref 22–32)
Calcium: 8.8 mg/dL — ABNORMAL LOW (ref 8.9–10.3)
Chloride: 107 mmol/L (ref 98–111)
Creatinine, Ser: 0.79 mg/dL (ref 0.61–1.24)
GFR calc Af Amer: >60 mL/min (ref >60)
GFR calc non Af Amer: >60 mL/min (ref >60)
Glucose, Bld: 111 mg/dL — ABNORMAL HIGH (ref 70–99)
Potassium: 4.4 mmol/L (ref 3.5–5.1)
Sodium: 139 mmol/L (ref 135–145)
Total Bilirubin: 0.3 mg/dL (ref 0.3–1.2)
Total Protein: 6.2 g/dL — ABNORMAL LOW (ref 6.5–8.1)

## 2018-08-12 LAB — FIBRINOGEN: Fibrinogen: 553 mg/dL — ABNORMAL HIGH (ref 210–475)

## 2018-08-12 LAB — FERRITIN: Ferritin: 264 ng/mL (ref 24–336)

## 2018-08-12 LAB — PHOSPHORUS: Phosphorus: 3 mg/dL (ref 2.5–4.6)

## 2018-08-12 LAB — C-REACTIVE PROTEIN
CRP: 1.8 mg/dL — ABNORMAL HIGH (ref <1.0)
CRP: 2.7 mg/dL — ABNORMAL HIGH (ref <1.0)

## 2018-08-12 LAB — D-DIMER, QUANTITATIVE
D-Dimer, Quant: 0.57 ug/mL-FEU — ABNORMAL HIGH (ref 0.00–0.50)
D-Dimer, Quant: 0.45 ug/mL-FEU (ref 0.00–0.50)

## 2018-08-12 LAB — MAGNESIUM: Magnesium: 2.1 mg/dL (ref 1.7–2.4)

## 2018-08-12 NOTE — Plan of Care (Signed)

## 2018-08-12 NOTE — Progress Notes (Signed)
PROGRESS NOTE                                                                                                                                                                                                             Patient Demographics:    Mark Li, is a 63 y.o. male, DOB - Apr 15, 1955, ZOX:[id number]  Outpatient Primary MD for the patient is [name], Benn Moulder, MD    LOS - 2  Chief Complaint  Patient presents with  . Fever  . Cough       Brief Narrative: Patient is a 63 y.o. male with PMHx of hypertension presented with several days history of fever-cough-further evaluation revealed acute hypoxemic respiratory failure secondary to COVID-19 viral pneumonia.  See below for further details.   Subjective:   No shortness of breath-no fever-still complains of some burning sensation with his rash in his upper back/neck area.  Continues to have some dry eyes.   Assessment  & Plan :  Acute Hypoxic Resp Failure due to Covid 19 Viral pneumonia: Febrile-not requiring oxygen-continues to improve-continue 5 days of Remdesivir, 3 days of steroids.  Since improving-no indication to escalate care with convalescent plasma Actemra.    COVID-19 Labs:  Recent Labs    08/10/18 1119  08/11/18 0450 08/11/18 0455 08/11/18 1645 08/12/18 0426  DDIMER 0.50   < > 0.51*  --  0.48 0.57*  FERRITIN 262  --   --  216  --  264  LDH 189  --   --   --   --   --   CRP 5.8*   < >  --  5.1* 3.8* 1.8*   < > = values in this interval not displayed.    Lab Results  Component Value Date   SARSCOV2NAA DETECTED (A) 08/10/2018     COVID-19 Medications: 6/13>> Remdesivir  Mild erythematous rash in the upper back: Not sure if this is secondary to COVID-19-or photosensitive rash secondary to being on doxycycline.  Supportive care at this point-no evidence of discoloration-continue steroids-add as needed Benadryl and scheduled Pepcid.  Will monitor  for now.  Hypertension: Controlled-continue lisinopril    ABG: No results found for: PHART, PCO2ART, PO2ART, HCO3, TCO2, ACIDBASEDEF, O2SAT  Condition -Stable  Family Communication  :  Spouse updated over the phone on 6/15  Code Status :  Full Code  Diet :  Diet Order            Diet regular Room service appropriate? Yes; Fluid consistency: Thin  Diet effective now               Disposition Plan  :  Remain inpatient  Consults  : None  Procedures  : None  DVT Prophylaxis  :  Lovenox  Lab Results  Component Value Date   PLT 164 08/12/2018    Inpatient Medications  Scheduled Meds: . docusate sodium  100 mg Oral BID  . enoxaparin (LOVENOX) injection  40 mg Subcutaneous Q24H  . famotidine  20 mg Oral Daily  . hydrocortisone cream   Topical QID  . lisinopril  10 mg Oral Daily  . methylPREDNISolone (SOLU-MEDROL) injection  60 mg Intravenous Q12H   Continuous Infusions: . remdesivir 100 mg in NS 250 mL 100 mg (08/11/18 1546)   PRN Meds:.acetaminophen, bisacodyl, diphenhydrAMINE, lip balm, naphazoline-pheniramine, ondansetron **OR** ondansetron (ZOFRAN) IV, oxyCODONE, polyethylene glycol, polyvinyl alcohol, simethicone  Antibiotics  :    Anti-infectives (From admission, onward)   Start     Dose/Rate Route Frequency Ordered Stop   08/11/18 1600  remdesivir 100 mg in sodium chloride 0.9 % 230 mL IVPB     100 mg 500 mL/hr over 30 Minutes Intravenous Every 24 hours 08/10/18 2102 08/15/18 1559   08/10/18 1500  remdesivir 200 mg in sodium chloride 0.9 % 210 mL IVPB     200 mg 500 mL/hr over 30 Minutes Intravenous Once 08/10/18 1418 08/10/18 1714   08/10/18 1300  vancomycin (VANCOCIN) 2,000 mg in sodium chloride 0.9 % 500 mL IVPB     2,000 mg 250 mL/hr over 120 Minutes Intravenous  Once 08/10/18 1146 08/10/18 1435   08/10/18 1230  ceFEPIme (MAXIPIME) 2 g in sodium chloride 0.9 % 100 mL IVPB     2 g 200 mL/hr over 30 Minutes Intravenous  Once 08/10/18 1146 08/10/18  1312   08/10/18 1230  doxycycline (VIBRAMYCIN) 100 mg in sodium chloride 0.9 % 250 mL IVPB  Status:  Discontinued     100 mg 125 mL/hr over 120 Minutes Intravenous Every 12 hours 08/10/18 1146 08/10/18 1414       Time Spent in minutes  25   Oren Binet M.D on 08/12/2018 at 10:36 AM  To page go to [email or web address] - use universal password  [facility] -  Office  [phone]   Admit date - 08/10/2018    2    Objective:   Vitals:   08/11/18 2300 08/12/18 0000 08/12/18 0212 08/12/18 0656  BP:   133/88 118/78  Pulse: 65 60 (!) 54 (!) 54  Resp: (!) 21 (!) 22 (!) 22 (!) 21  Temp:   98.2 F (36.8 C)   TempSrc:   Oral   SpO2: 92% 94% 94% 90%  Weight:      Height:        Wt Readings from Last 3 Encounters:  08/10/18 115.7 kg  03/20/13 114.3 kg  03/17/13 114.8 kg     Intake/Output Summary (Last 24 hours) at 08/12/2018 1036 Last data filed at 08/12/2018 0200 Gross per 24 hour  Intake 230 ml  Output 300 ml  Net -70 ml     Physical Exam General appearance:Awake, alert, not in any distress.  Eyes:no scleral icterus. HEENT: Atraumatic and Normocephalic Neck: supple, no JVD. Resp:Good air entry bilaterally,no rales or rhonchi CVS: S1 S2 regular, no murmurs.  GI: Bowel  sounds present, Non tender and not distended with no gaurding, rigidity or rebound. Extremities: B/L Lower Ext shows no edema, both legs are warm to touch Neurology:  Non focal Psychiatric: Normal judgment and insight. Normal mood. Musculoskeletal:No digital cyanosis Skin:No Rash, warm and dry Wounds:N/A   Data Review:    CBC Recent Labs  Lab 08/10/18 1119 08/11/18 0450 08/12/18 0426  WBC 3.6* 2.7* 7.5  HGB 17.3* 16.5 16.2  HCT 52.3* 48.6 48.9  PLT 127* 138* 164  MCV 94.1 93.5 93.7  MCH 31.1 31.7 31.0  MCHC 33.1 34.0 33.1  RDW 12.1 12.1 12.2  LYMPHSABS 1.4 0.9 1.3  MONOABS 0.4 0.2 0.7  EOSABS 0.0 0.0 0.0  BASOSABS 0.0 0.0 0.0    Chemistries  Recent Labs  Lab 08/10/18 1119  08/11/18 0450 08/12/18 0426  NA 136 138 139  K 3.9 4.2 4.4  CL 102 107 107  CO2 23 21* 23  GLUCOSE 91 146* 111*  BUN 14 15 19   CREATININE 1.11 0.76 0.79  CALCIUM 8.6* 8.3* 8.8*  MG  --  2.0 2.1  AST 25 18 22   ALT 27 22 26   ALKPHOS 56 45 46  BILITOT 0.7 0.4 0.3   ------------------------------------------------------------------------------------------------------------------ Recent Labs    08/10/18 1119  TRIG 98    No results found for: HGBA1C ------------------------------------------------------------------------------------------------------------------ No results for input(s): TSH, T4TOTAL, T3FREE, THYROIDAB in the last 72 hours.  Invalid input(s): FREET3 ------------------------------------------------------------------------------------------------------------------ Recent Labs    08/11/18 0455 08/12/18 0426  FERRITIN 216 264    Coagulation profile No results for input(s): INR, PROTIME in the last 168 hours.  Recent Labs    08/11/18 1645 08/12/18 0426  DDIMER 0.48 0.57*    Cardiac Enzymes No results for input(s): CKMB, TROPONINI, MYOGLOBIN in the last 168 hours.  Invalid input(s): CK ------------------------------------------------------------------------------------------------------------------ No results found for: BNP  Micro Results Recent Results (from the past 240 hour(s))  SARS Coronavirus 2     Status: Abnormal   Collection Time: 08/10/18 10:53 AM  Result Value Ref Range Status   SARS Coronavirus 2 DETECTED (A) NOT DETECTED Final    Comment: CRITICAL RESULT CALLED TO, READ BACK BY AND VERIFIED WITH: C.Mcarthur Rossetti, ED RN AT 1250 ON 08/10/18 BY C. JESSUP, MLT. (NOTE) SARS-CoV-2 target nucleic acids are DETECTED. The SARS-CoV-2 RNA is generally detectable in upper and lower respiratory specimens during the acute phase of infection. Positive results are indicative of active infection with SARS-CoV-2. Clinical  correlation with patient  history and other diagnostic information is necessary to determine patient infection status. Positive results do  not rule out bacterial infection or co-infection with other viruses. The expected result is Not Detected. Fact Sheet for Patients: http://www.biofiredefense.com/wp-content/uploads/2020/03/BIOFIRE-COVID -19-patients.pdf Fact Sheet for Healthcare Providers: http://www.biofiredefense.com/wp-content/uploads/2020/03/BIOFIRE-COVID -19-hcp.pdf This test is not yet approved or cleared by the [place] and  has been authorized for detection and/or diagnosis of SARS-C oV-2 by FDA under an Emergency Use Authorization (EUA).  This EUA will remain in effect (meaning this test can be used) for the duration of  the COVID-19 declaration under Section 564(b)(1) of the Act, 21 U.S.C. section [phone] 3(b)(1), unless the authorization is terminated or revoked sooner. Performed at [facility], [place] [address], [place],  [address]   Blood Culture (routine x 2)     Status: None (Preliminary result)   Collection Time: 08/10/18 11:31 AM   Specimen: BLOOD  Result Value Ref Range Status   Specimen Description BLOOD LEFT ANTECUBITAL  Final   Special  Requests   Final    BOTTLES DRAWN AEROBIC AND ANAEROBIC Blood Culture results may not be optimal due to an inadequate volume of blood received in culture bottles   Culture   Final    NO GROWTH 1 DAY Performed at [facility] [address] [place], [place] [address]    Report Status PENDING  Incomplete  Blood Culture (routine x 2)     Status: None (Preliminary result)   Collection Time: 08/10/18 12:06 PM   Specimen: BLOOD  Result Value Ref Range Status   Specimen Description BLOOD RIGHT ANTECUBITAL  Final   Special Requests   Final    BOTTLES DRAWN AEROBIC AND ANAEROBIC Blood Culture adequate volume   Culture   Final    NO GROWTH 1 DAY Performed at [facility], [place] [address] [place], [place] [address]     Report Status PENDING  Incomplete    Radiology Reports Dg Chest Port 1 View  Result Date: 08/10/2018 CLINICAL DATA:  63 year old male with cough and fever EXAM: PORTABLE CHEST 1 VIEW COMPARISON:  03/27/2012 FINDINGS: Cardiomediastinal silhouette unchanged in size and contour. Low lung volumes accentuating the interstitium. Patchy opacity in the mid right lung and at the left lung base. No pleural effusion or pneumothorax. No displaced fracture. No interlobular septal thickening or central vascular congestion. IMPRESSION: Low lung volumes with patchy opacities in the right mid lung and left lung base, potentially developing infection versus atelectasis. Electronically Signed   By: Corrie Mckusick D.O.   On: 08/10/2018 12:21

## 2018-08-13 LAB — COMPREHENSIVE METABOLIC PANEL
ALT: 102 U/L — ABNORMAL HIGH (ref 0–44)
AST: 59 U/L — ABNORMAL HIGH (ref 15–41)
Albumin: 3.3 g/dL — ABNORMAL LOW (ref 3.5–5.0)
Alkaline Phosphatase: 50 U/L (ref 38–126)
Anion gap: 10 (ref 5–15)
BUN: 20 mg/dL (ref 8–23)
CO2: 24 mmol/L (ref 22–32)
Calcium: 8.7 mg/dL — ABNORMAL LOW (ref 8.9–10.3)
Chloride: 106 mmol/L (ref 98–111)
Creatinine, Ser: 0.78 mg/dL (ref 0.61–1.24)
GFR calc Af Amer: >60 mL/min (ref >60)
GFR calc non Af Amer: >60 mL/min (ref >60)
Glucose, Bld: 112 mg/dL — ABNORMAL HIGH (ref 70–99)
Potassium: 4.3 mmol/L (ref 3.5–5.1)
Sodium: 140 mmol/L (ref 135–145)
Total Bilirubin: 0.3 mg/dL (ref 0.3–1.2)
Total Protein: 6.2 g/dL — ABNORMAL LOW (ref 6.5–8.1)

## 2018-08-13 LAB — CBC WITH DIFFERENTIAL/PLATELET
Abs Immature Granulocytes: 0.11 10*3/uL — ABNORMAL HIGH (ref 0.00–0.07)
Basophils Absolute: 0 10*3/uL (ref 0.0–0.1)
Basophils Relative: 0 %
Eosinophils Absolute: 0 10*3/uL (ref 0.0–0.5)
Eosinophils Relative: 0 %
HCT: 48.1 % (ref 39.0–52.0)
Hemoglobin: 15.7 g/dL (ref 13.0–17.0)
Immature Granulocytes: 2 %
Lymphocytes Relative: 15 %
Lymphs Abs: 1.1 10*3/uL (ref 0.7–4.0)
MCH: 30.4 pg (ref 26.0–34.0)
MCHC: 32.6 g/dL (ref 30.0–36.0)
MCV: 93.2 fL (ref 80.0–100.0)
Monocytes Absolute: 0.6 10*3/uL (ref 0.1–1.0)
Monocytes Relative: 7 %
Neutro Abs: 5.7 10*3/uL (ref 1.7–7.7)
Neutrophils Relative %: 76 %
Platelets: 173 10*3/uL (ref 150–400)
RBC: 5.16 MIL/uL (ref 4.22–5.81)
RDW: 12 % (ref 11.5–15.5)
WBC: 7.5 10*3/uL (ref 4.0–10.5)
nRBC: 0 % (ref 0.0–0.2)

## 2018-08-13 LAB — ROCKY MTN SPOTTED FVR ABS PNL(IGG+IGM)
RMSF IgG: NEGATIVE
RMSF IgM: 0.52 index (ref 0.00–0.89)

## 2018-08-13 LAB — MAGNESIUM: Magnesium: 2.2 mg/dL (ref 1.7–2.4)

## 2018-08-13 LAB — FERRITIN: Ferritin: 350 ng/mL — ABNORMAL HIGH (ref 24–336)

## 2018-08-13 LAB — FIBRINOGEN: Fibrinogen: 531 mg/dL — ABNORMAL HIGH (ref 210–475)

## 2018-08-13 LAB — PHOSPHORUS: Phosphorus: 3.2 mg/dL (ref 2.5–4.6)

## 2018-08-13 NOTE — Progress Notes (Signed)
PROGRESS NOTE                                                                                                                                                                                                             Patient Demographics:    Mark Li, is a 63 y.o. male, DOB - 04-Jul-1955, XAJ:[id number]  Outpatient Primary MD for the patient is [name], Benn Moulder, MD    LOS - 3  Chief Complaint  Patient presents with  . Fever  . Cough       Brief Narrative: Patient is a 63 y.o. male with PMHx of hypertension presented with several days history of fever-cough-further evaluation revealed acute hypoxemic respiratory failure secondary to COVID-19 viral pneumonia.  See below for further details.   Subjective:   Feels much better-remains on room air-afebrile.   Assessment  & Plan :  Acute Hypoxic Resp Failure due to Covid 19 Viral pneumonia: Afebrile-on room air-clinical improvement continues.  Continue Remdesivir x5 days, steroids x3 days.  If clinical improvement continues-probably can go home on 6/17 after patient finishes a course of Remdesivir.   COVID-19 Labs:  Recent Labs    08/11/18 0455 08/11/18 1645 08/12/18 0426 08/12/18 1630 08/13/18 0310  DDIMER  --  0.48 0.57* 0.45  --   FERRITIN 216  --  264  --  350*  CRP 5.1* 3.8* 1.8* 2.7*  --     Lab Results  Component Value Date   SARSCOV2NAA DETECTED (A) 08/10/2018     COVID-19 Medications: 6/13>> Remdesivir  Mild erythematous rash in the upper back: Not sure if this is secondary to COVID-19-or photosensitive rash secondary to being on doxycycline.  Improved-supportive care-on as needed Benadryl and Pepcid.    Mild transaminitis: Probably secondary to COVID-19 or from Remdesivir-follow for now-last dose of Remdesivir is on 6/17.  Hypertension: Controlled-continue lisinopril    ABG: No results found for: PHART, PCO2ART, PO2ART, HCO3, TCO2,  ACIDBASEDEF, O2SAT  Condition -Stable  Family Communication  :  Spouse updated over the phone on 6/16  Code Status :  Full Code  Diet :  Diet Order            Diet regular Room service appropriate? Yes; Fluid consistency: Thin  Diet effective now  Disposition Plan  :  Remain inpatient  Consults  : None  Procedures  : None  DVT Prophylaxis  :  Lovenox  Lab Results  Component Value Date   PLT 173 08/13/2018    Inpatient Medications  Scheduled Meds: . docusate sodium  100 mg Oral BID  . enoxaparin (LOVENOX) injection  40 mg Subcutaneous Q24H  . famotidine  20 mg Oral Daily  . hydrocortisone cream   Topical QID  . lisinopril  10 mg Oral Daily   Continuous Infusions: . remdesivir 100 mg in NS 250 mL 100 mg (08/12/18 1635)   PRN Meds:.acetaminophen, bisacodyl, diphenhydrAMINE, lip balm, naphazoline-pheniramine, ondansetron **OR** ondansetron (ZOFRAN) IV, oxyCODONE, polyethylene glycol, polyvinyl alcohol, simethicone  Antibiotics  :    Anti-infectives (From admission, onward)   Start     Dose/Rate Route Frequency Ordered Stop   08/11/18 1600  remdesivir 100 mg in sodium chloride 0.9 % 230 mL IVPB     100 mg 500 mL/hr over 30 Minutes Intravenous Every 24 hours 08/10/18 2102 08/15/18 1559   08/10/18 1500  remdesivir 200 mg in sodium chloride 0.9 % 210 mL IVPB     200 mg 500 mL/hr over 30 Minutes Intravenous Once 08/10/18 1418 08/10/18 1714   08/10/18 1300  vancomycin (VANCOCIN) 2,000 mg in sodium chloride 0.9 % 500 mL IVPB     2,000 mg 250 mL/hr over 120 Minutes Intravenous  Once 08/10/18 1146 08/10/18 1435   08/10/18 1230  ceFEPIme (MAXIPIME) 2 g in sodium chloride 0.9 % 100 mL IVPB     2 g 200 mL/hr over 30 Minutes Intravenous  Once 08/10/18 1146 08/10/18 1312   08/10/18 1230  doxycycline (VIBRAMYCIN) 100 mg in sodium chloride 0.9 % 250 mL IVPB  Status:  Discontinued     100 mg 125 mL/hr over 120 Minutes Intravenous Every 12 hours 08/10/18 1146  08/10/18 1414       Time Spent in minutes  25   Oren Binet M.D on 08/13/2018 at 12:21 PM  To page go to [email or web address] - use universal password  [facility] -  Office  [phone]   Admit date - 08/10/2018    3    Objective:   Vitals:   08/13/18 0031 08/13/18 0500 08/13/18 0843 08/13/18 1125  BP: 124/84 137/90 (!) 141/91   Pulse: (!) 53  60   Resp: 19 19 12    Temp: 98.9 F (37.2 C) (!) 97.5 F (36.4 C) 98.7 F (37.1 C) 98.5 F (36.9 C)  TempSrc: Oral Oral Oral Oral  SpO2:  96% 93%   Weight:      Height:        Wt Readings from Last 3 Encounters:  08/10/18 115.7 kg  03/20/13 114.3 kg  03/17/13 114.8 kg     Intake/Output Summary (Last 24 hours) at 08/13/2018 1221 Last data filed at 08/13/2018 0926 Gross per 24 hour  Intake 360 ml  Output -  Net 360 ml     Physical Exam General appearance:Awake, alert, not in any distress.  Eyes:no scleral icterus. HEENT: Atraumatic and Normocephalic Neck: supple, no JVD. Resp:Good air entry bilaterally,no rales or rhonchi CVS: S1 S2 regular, no murmurs.  GI: Bowel sounds present, Non tender and not distended with no gaurding, rigidity or rebound. Extremities: B/L Lower Ext shows no edema, both legs are warm to touch Neurology:  Non focal Psychiatric: Normal judgment and insight. Normal mood. Musculoskeletal:No digital cyanosis Skin: Very faint rash remains on the posterior part of his  neck and upper back Wounds:N/A   Data Review:    CBC Recent Labs  Lab 08/10/18 1119 08/11/18 0450 08/12/18 0426 08/13/18 0310  WBC 3.6* 2.7* 7.5 7.5  HGB 17.3* 16.5 16.2 15.7  HCT 52.3* 48.6 48.9 48.1  PLT 127* 138* 164 173  MCV 94.1 93.5 93.7 93.2  MCH 31.1 31.7 31.0 30.4  MCHC 33.1 34.0 33.1 32.6  RDW 12.1 12.1 12.2 12.0  LYMPHSABS 1.4 0.9 1.3 1.1  MONOABS 0.4 0.2 0.7 0.6  EOSABS 0.0 0.0 0.0 0.0  BASOSABS 0.0 0.0 0.0 0.0    Chemistries  Recent Labs  Lab 08/10/18 1119 08/11/18 0450 08/12/18 0426  08/13/18 0310  NA 136 138 139 140  K 3.9 4.2 4.4 4.3  CL 102 107 107 106  CO2 23 21* 23 24  GLUCOSE 91 146* 111* 112*  BUN 14 15 19 20   CREATININE 1.11 0.76 0.79 0.78  CALCIUM 8.6* 8.3* 8.8* 8.7*  MG  --  2.0 2.1 2.2  AST 25 18 22  59*  ALT 27 22 26  102*  ALKPHOS 56 45 46 50  BILITOT 0.7 0.4 0.3 0.3   ------------------------------------------------------------------------------------------------------------------ No results for input(s): CHOL, HDL, LDLCALC, TRIG, CHOLHDL, LDLDIRECT in the last 72 hours.  No results found for: HGBA1C ------------------------------------------------------------------------------------------------------------------ No results for input(s): TSH, T4TOTAL, T3FREE, THYROIDAB in the last 72 hours.  Invalid input(s): FREET3 ------------------------------------------------------------------------------------------------------------------ Recent Labs    08/12/18 0426 08/13/18 0310  FERRITIN 264 350*    Coagulation profile No results for input(s): INR, PROTIME in the last 168 hours.  Recent Labs    08/12/18 0426 08/12/18 1630  DDIMER 0.57* 0.45    Cardiac Enzymes No results for input(s): CKMB, TROPONINI, MYOGLOBIN in the last 168 hours.  Invalid input(s): CK ------------------------------------------------------------------------------------------------------------------ No results found for: BNP  Micro Results Recent Results (from the past 240 hour(s))  SARS Coronavirus 2     Status: Abnormal   Collection Time: 08/10/18 10:53 AM  Result Value Ref Range Status   SARS Coronavirus 2 DETECTED (A) NOT DETECTED Final    Comment: CRITICAL RESULT CALLED TO, READ BACK BY AND VERIFIED WITH: C.Mcarthur Rossetti, ED RN AT 1250 ON 08/10/18 BY C. JESSUP, MLT. (NOTE) SARS-CoV-2 target nucleic acids are DETECTED. The SARS-CoV-2 RNA is generally detectable in upper and lower respiratory specimens during the acute phase of infection. Positive results are  indicative of active infection with SARS-CoV-2. Clinical  correlation with patient history and other diagnostic information is necessary to determine patient infection status. Positive results do  not rule out bacterial infection or co-infection with other viruses. The expected result is Not Detected. Fact Sheet for Patients: http://www.biofiredefense.com/wp-content/uploads/2020/03/BIOFIRE-COVID -19-patients.pdf Fact Sheet for Healthcare Providers: http://www.biofiredefense.com/wp-content/uploads/2020/03/BIOFIRE-COVID -19-hcp.pdf This test is not yet approved or cleared by the [place] and  has been authorized for detection and/or diagnosis of SARS-C oV-2 by FDA under an Emergency Use Authorization (EUA).  This EUA will remain in effect (meaning this test can be used) for the duration of  the COVID-19 declaration under Section 564(b)(1) of the Act, 21 U.S.C. section [phone] 3(b)(1), unless the authorization is terminated or revoked sooner. Performed at [facility], [place] [address] [place], [place] [address]   Blood Culture (routine x 2)     Status: None (Preliminary result)   Collection Time: 08/10/18 11:31 AM   Specimen: BLOOD  Result Value Ref Range Status   Specimen Description BLOOD LEFT ANTECUBITAL  Final   Special Requests   Final    BOTTLES DRAWN  AEROBIC AND ANAEROBIC Blood Culture results may not be optimal due to an inadequate volume of blood received in culture bottles   Culture   Final    NO GROWTH 1 DAY Performed at [place] [address] [place], [place] [address]    Report Status PENDING  Incomplete  Blood Culture (routine x 2)     Status: None (Preliminary result)   Collection Time: 08/10/18 12:06 PM   Specimen: BLOOD  Result Value Ref Range Status   Specimen Description BLOOD RIGHT ANTECUBITAL  Final   Special Requests   Final    BOTTLES DRAWN AEROBIC AND ANAEROBIC Blood Culture adequate volume   Culture   Final    NO GROWTH 1 DAY  Performed at [facility], [place] [address] [place], [place] [address]    Report Status PENDING  Incomplete    Radiology Reports Dg Chest Port 1 View  Result Date: 08/10/2018 CLINICAL DATA:  63 year old male with cough and fever EXAM: PORTABLE CHEST 1 VIEW COMPARISON:  03/27/2012 FINDINGS: Cardiomediastinal silhouette unchanged in size and contour. Low lung volumes accentuating the interstitium. Patchy opacity in the mid right lung and at the left lung base. No pleural effusion or pneumothorax. No displaced fracture. No interlobular septal thickening or central vascular congestion. IMPRESSION: Low lung volumes with patchy opacities in the right mid lung and left lung base, potentially developing infection versus atelectasis. Electronically Signed   By: Corrie Mckusick D.O.   On: 08/10/2018 12:21

## 2018-08-13 NOTE — Progress Notes (Signed)
Asked patient if he wanted me to call wife and give updates on his condition. He requested that I not call her. He stated that he would call her and give her updates on his condition and how he is feeing.  I will respect patient wishes and make no contact with family.

## 2018-08-13 NOTE — Discharge Instructions (Signed)
Person Under Monitoring Name: Mark Li  Location: [place] [address]   Infection Prevention Recommendations for Individuals Confirmed to have, or Being Evaluated for, 2019 Novel Coronavirus (COVID-19) Infection Who Receive Care at Home  Individuals who are confirmed to have, or are being evaluated for, COVID-19 should follow the prevention steps below until a healthcare provider or local or state health department says they can return to normal activities.  Stay home except to get medical care You should restrict activities outside your home, except for getting medical care. Do not go to work, school, or public areas, and do not use public transportation or taxis.  Call ahead before visiting your doctor Before your medical appointment, call the healthcare provider and tell them that you have, or are being evaluated for, COVID-19 infection. This will help the healthcare providers office take steps to keep other people from getting infected. Ask your healthcare provider to call the local or state health department.  Monitor your symptoms Seek prompt medical attention if your illness is worsening (e.g., difficulty breathing). Before going to your medical appointment, call the healthcare provider and tell them that you have, or are being evaluated for, COVID-19 infection. Ask your healthcare provider to call the local or state health department.  Wear a facemask You should wear a facemask that covers your nose and mouth when you are in the same room with other people and when you visit a healthcare provider. People who live with or visit you should also wear a facemask while they are in the same room with you.  Separate yourself from other people in your home As much as possible, you should stay in a different room from other people in your home. Also, you should use a separate bathroom, if available.  Avoid sharing household items You should not share  dishes, drinking glasses, cups, eating utensils, towels, bedding, or other items with other people in your home. After using these items, you should wash them thoroughly with soap and water.  Cover your coughs and sneezes Cover your mouth and nose with a tissue when you cough or sneeze, or you can cough or sneeze into your sleeve. Throw used tissues in a lined trash can, and immediately wash your hands with soap and water for at least 20 seconds or use an alcohol-based hand rub.  Wash your [facility] your hands often and thoroughly with soap and water for at least 20 seconds. You can use an alcohol-based hand sanitizer if soap and water are not available and if your hands are not visibly dirty. Avoid touching your eyes, nose, and mouth with unwashed hands.   Prevention Steps for Caregivers and Household Members of Individuals Confirmed to have, or Being Evaluated for, COVID-19 Infection Being Cared for in the Home  If you live with, or provide care at home for, a person confirmed to have, or being evaluated for, COVID-19 infection please follow these guidelines to prevent infection:  Follow healthcare providers instructions Make sure that you understand and can help the patient follow any healthcare provider instructions for all care.  Provide for the patients basic needs You should help the patient with basic needs in the home and provide support for getting groceries, prescriptions, and other personal needs.  Monitor the patients symptoms If they are getting sicker, call his or her medical provider and tell them that the patient has, or is being evaluated for, COVID-19 infection. This will help the healthcare providers office  take steps to keep other people from getting infected. Ask the healthcare provider to call the local or state health department.  Limit the number of people who have contact with the patient  If possible, have only one caregiver for the patient.  Other  household members should stay in another home or place of residence. If this is not possible, they should stay  in another room, or be separated from the patient as much as possible. Use a separate bathroom, if available.  Restrict visitors who do not have an essential need to be in the home.  Keep older adults, very young children, and other sick people away from the patient Keep older adults, very young children, and those who have compromised immune systems or chronic health conditions away from the patient. This includes people with chronic heart, lung, or kidney conditions, diabetes, and cancer.  Ensure good ventilation Make sure that shared spaces in the home have good air flow, such as from an air conditioner or an opened window, weather permitting.  Wash your hands often  Wash your hands often and thoroughly with soap and water for at least 20 seconds. You can use an alcohol based hand sanitizer if soap and water are not available and if your hands are not visibly dirty.  Avoid touching your eyes, nose, and mouth with unwashed hands.  Use disposable paper towels to dry your hands. If not available, use dedicated cloth towels and replace them when they become wet.  Wear a facemask and gloves  Wear a disposable facemask at all times in the room and gloves when you touch or have contact with the patients blood, body fluids, and/or secretions or excretions, such as sweat, saliva, sputum, nasal mucus, vomit, urine, or feces.  Ensure the mask fits over your nose and mouth tightly, and do not touch it during use.  Throw out disposable facemasks and gloves after using them. Do not reuse.  Wash your hands immediately after removing your facemask and gloves.  If your personal clothing becomes contaminated, carefully remove clothing and launder. Wash your hands after handling contaminated clothing.  Place all used disposable facemasks, gloves, and other waste in a lined container before  disposing them with other household waste.  Remove gloves and wash your hands immediately after handling these items.  Do not share dishes, glasses, or other household items with the patient  Avoid sharing household items. You should not share dishes, drinking glasses, cups, eating utensils, towels, bedding, or other items with a patient who is confirmed to have, or being evaluated for, COVID-19 infection.  After the person uses these items, you should wash them thoroughly with soap and water.  Wash laundry thoroughly  Immediately remove and wash clothes or bedding that have blood, body fluids, and/or secretions or excretions, such as sweat, saliva, sputum, nasal mucus, vomit, urine, or feces, on them.  Wear gloves when handling laundry from the patient.  Read and follow directions on labels of laundry or clothing items and detergent. In general, wash and dry with the warmest temperatures recommended on the label.  Clean all areas the individual has used often  Clean all touchable surfaces, such as counters, tabletops, doorknobs, bathroom fixtures, toilets, phones, keyboards, tablets, and bedside tables, every day. Also, clean any surfaces that may have blood, body fluids, and/or secretions or excretions on them.  Wear gloves when cleaning surfaces the patient has come in contact with.  Use a diluted bleach solution (e.g., dilute bleach with 1 part  bleach and 10 parts water) or a household disinfectant with a label that says EPA-registered for coronaviruses. To make a bleach solution at home, add 1 tablespoon of bleach to 1 quart (4 cups) of water. For a larger supply, add  cup of bleach to 1 gallon (16 cups) of water.  Read labels of cleaning products and follow recommendations provided on product labels. Labels contain instructions for safe and effective use of the cleaning product including precautions you should take when applying the product, such as wearing gloves or eye protection  and making sure you have good ventilation during use of the product.  Remove gloves and wash hands immediately after cleaning.  Monitor yourself for signs and symptoms of illness Caregivers and household members are considered close contacts, should monitor their health, and will be asked to limit movement outside of the home to the extent possible. Follow the monitoring steps for close contacts listed on the symptom monitoring form.   ? If you have additional questions, contact your local health department or call the epidemiologist on call at [phone] (available 24/7). ? This guidance is subject to change. For the most up-to-date guidance from [facility], please refer to their website: [email or web address]

## 2018-08-14 LAB — COMPREHENSIVE METABOLIC PANEL WITH GFR
ALT: 127 U/L — ABNORMAL HIGH (ref 0–44)
AST: 55 U/L — ABNORMAL HIGH (ref 15–41)
Albumin: 3.1 g/dL — ABNORMAL LOW (ref 3.5–5.0)
Alkaline Phosphatase: 48 U/L (ref 38–126)
Anion gap: 9 (ref 5–15)
BUN: 19 mg/dL (ref 8–23)
CO2: 25 mmol/L (ref 22–32)
Calcium: 8.6 mg/dL — ABNORMAL LOW (ref 8.9–10.3)
Chloride: 106 mmol/L (ref 98–111)
Creatinine, Ser: 0.81 mg/dL (ref 0.61–1.24)
GFR calc Af Amer: >60 mL/min
GFR calc non Af Amer: >60 mL/min
Glucose, Bld: 93 mg/dL (ref 70–99)
Potassium: 4 mmol/L (ref 3.5–5.1)
Sodium: 140 mmol/L (ref 135–145)
Total Bilirubin: 0.3 mg/dL (ref 0.3–1.2)
Total Protein: 5.8 g/dL — ABNORMAL LOW (ref 6.5–8.1)

## 2018-08-14 LAB — CBC WITH DIFFERENTIAL/PLATELET
Abs Immature Granulocytes: 0.22 10*3/uL — ABNORMAL HIGH (ref 0.00–0.07)
Basophils Absolute: 0.1 10*3/uL (ref 0.0–0.1)
Basophils Relative: 1 %
Eosinophils Absolute: 0 10*3/uL (ref 0.0–0.5)
Eosinophils Relative: 0 %
HCT: 45.6 % (ref 39.0–52.0)
Hemoglobin: 15.4 g/dL (ref 13.0–17.0)
Immature Granulocytes: 3 %
Lymphocytes Relative: 39 %
Lymphs Abs: 3.3 10*3/uL (ref 0.7–4.0)
MCH: 31.5 pg (ref 26.0–34.0)
MCHC: 33.8 g/dL (ref 30.0–36.0)
MCV: 93.3 fL (ref 80.0–100.0)
Monocytes Absolute: 0.9 10*3/uL (ref 0.1–1.0)
Monocytes Relative: 10 %
Neutro Abs: 4 10*3/uL (ref 1.7–7.7)
Neutrophils Relative %: 47 %
Platelets: 171 10*3/uL (ref 150–400)
RBC: 4.89 MIL/uL (ref 4.22–5.81)
RDW: 12.3 % (ref 11.5–15.5)
WBC: 8.4 10*3/uL (ref 4.0–10.5)
nRBC: 0 % (ref 0.0–0.2)

## 2018-08-14 LAB — FIBRINOGEN: Fibrinogen: 477 mg/dL — ABNORMAL HIGH (ref 210–475)

## 2018-08-14 LAB — PHOSPHORUS: Phosphorus: 3.6 mg/dL (ref 2.5–4.6)

## 2018-08-14 LAB — FERRITIN: Ferritin: 335 ng/mL (ref 24–336)

## 2018-08-14 LAB — MAGNESIUM: Magnesium: 2.1 mg/dL (ref 1.7–2.4)

## 2018-08-14 MED ORDER — FAMOTIDINE 20 MG PO TABS: 20.0000 mg | ORAL_TABLET | Freq: Every day | ORAL | 0 refills | Status: DC

## 2018-08-14 MED ORDER — METHYLPREDNISOLONE SODIUM SUCC 40 MG IJ SOLR
40.0000 mg | Freq: Once | INTRAMUSCULAR | Status: AC
  Administered 2018-08-14: 40 mg via INTRAVENOUS
  Filled 2018-08-14: qty 1

## 2018-08-14 MED ORDER — ZINC 220 (50 ZN) MG PO CAPS: 220.0000 mg | ORAL_CAPSULE | Freq: Every day | ORAL | 0 refills | Status: DC

## 2018-08-14 MED ORDER — VITAMIN C 250 MG PO TABS: 250.0000 mg | ORAL_TABLET | Freq: Every day | ORAL | Status: DC

## 2018-08-14 NOTE — Discharge Summary (Signed)
Mark Li, is a 63 y.o. male  DOB 18-Feb-1956  MRN [id number].  Admission date:  08/10/2018  Admitting Physician  Karmen Bongo, MD  Discharge Date:  08/14/2018   Primary MD  Orma Flaming, MD  Recommendations for primary care physician for things to follow:  -Check CBC, BMP, LFTs during next visit, to ensure stable and improving liver functions.   Admission Diagnosis  Hypoxia [R09.02] COVID-19 [U07.1, J98.8]   Discharge Diagnosis  Hypoxia [R09.02] COVID-19 [U07.1, J98.8]    Principal Problem:   Acute respiratory disease due to COVID-19 virus Active Problems:   Essential hypertension      Past Medical History:  Diagnosis Date  . Hypertension   . Sleep disturbance 03/20/2013    Past Surgical History:  Procedure Laterality Date  . APPENDECTOMY    . FRACTURE SURGERY         History of present illness and  Hospital Course:     Kindly see H&P for history of present illness and admission details, please review complete Labs, Consult reports and Test reports for all details in brief  HPI  from the history and physical done on the day of admission 08/10/2018  HPI: Mark Li is a 63 y.o. male with medical history significant of HTN presenting with fever, cough.  Patient has mostly stayed inside recently due to his vacation in [place] with family.  He left last weekend and developed onset of symptoms on Wednesday - fatigue, low-grade fever, night sweats, severe cough, and SOB.  They left the vacation early due to concern that he could have COVID.  He also has a painful rash along his shoulders, chest, and back.  He called his PCP yesterday for a televisit, but they were driving home from [place] at that time.  He was encouraged to go in for evaluation today and was prescribed PO Doxy and a Medrol dose pack.  He went in for evaluation today at [facility] and was told to go to  the ER for further evaluation and treatment.    ED Course:  Family vacation with Lestine Mount grandbaby, left vacation early because he felt bad.  Has COVID.  Mild tachypnea, 93% and being put on O2.  Has a rash on upper chest and shoulders - concern for tick bite.  Given broad-spectrum antibiotics, likely does not need to be continued.  Hospital Course   Patient is a 62 y.o. male with PMHx of hypertension presented with several days history of fever-cough-further evaluation revealed acute hypoxemic respiratory failure secondary to COVID-19 viral pneumonia.  See below for further details.  Acute Hypoxic Resp Failure due to Covid 19 Viral pneumonia: Afebrile-on room air-clinical improvement continues.  recieved Remdesivir x5 days, and IV steroids, continues to improve clinically, he is on room air, eager to go home today. -Resp  failure resolved, he is on room air -Patient to finish his home dose Medrol pack taper, instructed to stop doxycycline  COVID-19 Labs: COVID-19 Labs  Recent Labs    08/11/18  1645 08/12/18 0426 08/12/18 1630 08/13/18 0310 08/14/18 0344  DDIMER 0.48 0.57* 0.45  --   --   FERRITIN  --  264  --  350* 335  CRP 3.8* 1.8* 2.7*  --   --     Lab Results  Component Value Date   SARSCOV2NAA DETECTED (A) 08/10/2018      Recent Labs       Lab Results  Component Value Date   SARSCOV2NAA DETECTED (A) 08/10/2018       COVID-19 Medications: 6/13>> Remdesivir  Mild erythematous rash in the upper back: Not sure if this is secondary to COVID-19-or photosensitive rash secondary to being on doxycycline.  Improved-supportive care-on as needed Benadryl and Pepcid.    Mild transaminitis: Probably secondary to COVID-19 or from Remdesivir-slightly trending up, , to repeat LFTs level as an outpatient during next visit . Hepatic Function Panel     Component Value Date/Time   PROT 5.8 (L) 08/14/2018 0344   ALBUMIN 3.1 (L) 08/14/2018 0344   AST 55 (H) 08/14/2018 0344    ALT 127 (H) 08/14/2018 0344   ALKPHOS 48 08/14/2018 0344   BILITOT 0.3 08/14/2018 0344    Hypertension: Controlled-continue lisinopril     Discharge Condition:  stable   Follow UP  Follow-up Information    Orma Flaming, MD Follow up in 2 week(s).   Specialty: Internal Medicine Contact information: [place] [address] [phone]             Discharge Instructions  and  Discharge Medications     Discharge Instructions    Discharge instructions   Complete by: As directed    Follow with Primary MD Elder Cyphers Benn Moulder, MD in 7 days   Get CBC, CMP,  checked  by Primary MD next visit.    Activity: As tolerated with Full fall precautions use walker/cane & assistance as needed   Disposition Home    Diet: Heart Healthy  , with feeding assistance and aspiration precautions.  For Heart failure patients - Check your Weight same time everyday, if you gain over 2 pounds, or you develop in leg swelling, experience more shortness of breath or chest pain, call your Primary MD immediately. Follow Cardiac Low Salt Diet and 1.5 lit/day fluid restriction.   On your next visit with your primary care physician please Get Medicines reviewed and adjusted.   Please request your Prim.MD to go over all Hospital Tests and Procedure/Radiological results at the follow up, please get all Hospital records sent to your Prim MD by signing hospital release before you go home.   If you experience worsening of your admission symptoms, develop shortness of breath, life threatening emergency, suicidal or homicidal thoughts you must seek medical attention immediately by calling 911 or calling your MD immediately  if symptoms less severe.  You Must read complete instructions/literature along with all the possible adverse reactions/side effects for all the Medicines you take and that have been prescribed to you. Take any new Medicines after you have completely understood and accpet  all the possible adverse reactions/side effects.   Do not drive, operating heavy machinery, perform activities at heights, swimming or participation in water activities or provide baby sitting services if your were admitted for syncope or siezures until you have seen by Primary MD or a Neurologist and advised to do so again.  Do not drive when taking Pain medications.    Do not take more than prescribed Pain, Sleep and Anxiety Medications  Special Instructions: If you have smoked or chewed Tobacco  in the last 2 yrs please stop smoking, stop any regular Alcohol  and or any Recreational drug use.  Wear Seat belts while driving.   Please note  You were cared for by a hospitalist during your hospital stay. If you have any questions about your discharge medications or the care you received while you were in the hospital after you are discharged, you can call the unit and asked to speak with the hospitalist on call if the hospitalist that took care of you is not available. Once you are discharged, your primary care physician will handle any further medical issues. Please note that NO REFILLS for any discharge medications will be authorized once you are discharged, as it is imperative that you return to your primary care physician (or establish a relationship with a primary care physician if you do not have one) for your aftercare needs so that they can reassess your need for medications and monitor your lab values.   Increase activity slowly   Complete by: As directed      Allergies as of 08/14/2018      Reactions   Sm Antibiotic Plus Pain Relief  [neomycin-polymyxin-pramoxine] Other (See Comments)   Patient does not tolerate Antibiotics well   Amoxicillin    Penicillins    Zithromax [azithromycin Dihydrate]       Medication List    STOP taking these medications   acetaminophen 325 MG tablet Commonly known as: TYLENOL   doxycycline 100 MG capsule Commonly known as: VIBRAMYCIN      TAKE these medications   famotidine 20 MG tablet Commonly known as: PEPCID Take 1 tablet (20 mg total) by mouth daily. Start taking on: August 15, 2018   lisinopril 10 MG tablet Commonly known as: ZESTRIL Take 10 mg by mouth daily.   methylPREDNISolone 4 MG Tbpk tablet Commonly known as: MEDROL DOSEPAK Take 4-24 mg by mouth See admin instructions. Take 6 tablets (24 mg totally) by mouth in day 1; take 5 tablets (20 mg totally) in day 2; take 4 tablets (16 mg totally) in day 3; take 3 tablets (12 mg totally) in day 4; take 2 tablets (8 mg totally) in day 5; and 1 tablet (4 mg totally) in day 6.   vitamin C 250 MG tablet Commonly known as: ASCORBIC ACID Take 1 tablet (250 mg total) by mouth daily. Please take for 2 weeks   Zinc 220 (50 Zn) MG Caps Take 220 mg by mouth daily. Please take for 2 weeks         Diet and Activity recommendation: See Discharge Instructions above   Consults obtained - none   Major procedures and Radiology Reports - PLEASE review detailed and final reports for all details, in brief -      Dg Chest Port 1 View  Result Date: 08/10/2018 CLINICAL DATA:  63 year old male with cough and fever EXAM: PORTABLE CHEST 1 VIEW COMPARISON:  03/27/2012 FINDINGS: Cardiomediastinal silhouette unchanged in size and contour. Low lung volumes accentuating the interstitium. Patchy opacity in the mid right lung and at the left lung base. No pleural effusion or pneumothorax. No displaced fracture. No interlobular septal thickening or central vascular congestion. IMPRESSION: Low lung volumes with patchy opacities in the right mid lung and left lung base, potentially developing infection versus atelectasis. Electronically Signed   By: Corrie Mckusick D.O.   On: 08/10/2018 12:21    Micro Results    Recent Results (  from the past 240 hour(s))  SARS Coronavirus 2     Status: Abnormal   Collection Time: 08/10/18 10:53 AM  Result Value Ref Range Status   SARS Coronavirus 2  DETECTED (A) NOT DETECTED Final    Comment: CRITICAL RESULT CALLED TO, READ BACK BY AND VERIFIED WITH: C.Mcarthur Rossetti, ED RN AT 1250 ON 08/10/18 BY C. JESSUP, MLT. (NOTE) SARS-CoV-2 target nucleic acids are DETECTED. The SARS-CoV-2 RNA is generally detectable in upper and lower respiratory specimens during the acute phase of infection. Positive results are indicative of active infection with SARS-CoV-2. Clinical  correlation with patient history and other diagnostic information is necessary to determine patient infection status. Positive results do  not rule out bacterial infection or co-infection with other viruses. The expected result is Not Detected. Fact Sheet for Patients: http://www.biofiredefense.com/wp-content/uploads/2020/03/BIOFIRE-COVID -19-patients.pdf Fact Sheet for Healthcare Providers: http://www.biofiredefense.com/wp-content/uploads/2020/03/BIOFIRE-COVID -19-hcp.pdf This test is not yet approved or cleared by the [place] and  has been authorized for detection and/or diagnosis of SARS-C oV-2 by FDA under an Emergency Use Authorization (EUA).  This EUA will remain in effect (meaning this test can be used) for the duration of  the COVID-19 declaration under Section 564(b)(1) of the Act, 21 U.S.C. section [phone] 3(b)(1), unless the authorization is terminated or revoked sooner. Performed at [facility], [place] [address] [place], [place] [address]   Blood Culture (routine x 2)     Status: None (Preliminary result)   Collection Time: 08/10/18 11:31 AM   Specimen: BLOOD  Result Value Ref Range Status   Specimen Description BLOOD LEFT ANTECUBITAL  Final   Special Requests   Final    BOTTLES DRAWN AEROBIC AND ANAEROBIC Blood Culture results may not be optimal due to an inadequate volume of blood received in culture bottles   Culture   Final    NO GROWTH 3 DAYS Performed at [facility], [place] [address], [place], [place] [address]    Report Status  PENDING  Incomplete  Blood Culture (routine x 2)     Status: None (Preliminary result)   Collection Time: 08/10/18 12:06 PM   Specimen: BLOOD  Result Value Ref Range Status   Specimen Description BLOOD RIGHT ANTECUBITAL  Final   Special Requests   Final    BOTTLES DRAWN AEROBIC AND ANAEROBIC Blood Culture adequate volume   Culture   Final    NO GROWTH 3 DAYS Performed at [facility], [place] [address] [place], [place] [address]    Report Status PENDING  Incomplete       Today   Subjective:   Arshdeep Bolger today has no headache,no chest abdominal pain,no new weakness tingling or numbness, feels much better wants to go home today.   Objective:   Blood pressure (!) 129/99, pulse 61, temperature 97.7 F (36.5 C), temperature source Oral, resp. rate 14, height 5\' 11"  (1.803 m), weight 115.7 kg, SpO2 93 %.   Intake/Output Summary (Last 24 hours) at 08/14/2018 1150 Last data filed at 08/13/2018 1422 Gross per 24 hour  Intake -  Output 1 ml  Net -1 ml    Exam Awake Alert, Oriented x 3, No new F.N deficits, Normal affect Symmetrical Chest wall movement, Good air movement bilaterally, CTAB RRR,No Gallops,Rubs or new Murmurs, No Parasternal Heave +ve B.Sounds, Abd Soft, Non tender, No organomegaly appriciated, No rebound -guarding or rigidity. No Cyanosis, Clubbing or edema, No new Rash or bruise  Data Review   CBC w Diff:  Lab  Results  Component Value Date   WBC 8.4 08/14/2018   HGB 15.4 08/14/2018   HCT 45.6 08/14/2018   PLT 171 08/14/2018   LYMPHOPCT 39 08/14/2018   MONOPCT 10 08/14/2018   EOSPCT 0 08/14/2018   BASOPCT 1 08/14/2018    CMP:  Lab Results  Component Value Date   NA 140 08/14/2018   K 4.0 08/14/2018   CL 106 08/14/2018   CO2 25 08/14/2018   BUN 19 08/14/2018   CREATININE 0.81 08/14/2018   PROT 5.8 (L) 08/14/2018   ALBUMIN 3.1 (L) 08/14/2018   BILITOT 0.3 08/14/2018   ALKPHOS 48 08/14/2018   AST 55 (H) 08/14/2018   ALT 127 (H)  08/14/2018  .   Total Time in preparing paper work, data evaluation and todays exam - 42 minutes  Phillips Climes M.D on 08/14/2018 at 11:50 AM  [facility]   Office  [phone]

## 2018-08-15 LAB — CULTURE, BLOOD (ROUTINE X 2)
Culture: NO GROWTH
Culture: NO GROWTH
Special Requests: ADEQUATE

## 2018-08-17 ENCOUNTER — Encounter (HOSPITAL_COMMUNITY): Payer: Self-pay | Admitting: Emergency Medicine

## 2018-08-17 ENCOUNTER — Other Ambulatory Visit: Payer: Self-pay

## 2018-08-17 ENCOUNTER — Emergency Department (HOSPITAL_COMMUNITY)
Admission: EM | Admit: 2018-08-17 | Discharge: 2018-08-17 | Disposition: A | Discharge status: Home / Self Care | Payer: BC Managed Care – PPO | Attending: Emergency Medicine | Admitting: Emergency Medicine

## 2018-08-17 DIAGNOSIS — I808 Phlebitis and thrombophlebitis of other sites: Secondary | ICD-10-CM | POA: Insufficient documentation

## 2018-08-17 DIAGNOSIS — Z79899 Other long term (current) drug therapy: Secondary | ICD-10-CM | POA: Insufficient documentation

## 2018-08-17 DIAGNOSIS — I10 Essential (primary) hypertension: Secondary | ICD-10-CM | POA: Diagnosis not present

## 2018-08-17 DIAGNOSIS — M79601 Pain in right arm: Secondary | ICD-10-CM | POA: Diagnosis present

## 2018-08-17 DIAGNOSIS — Z87891 Personal history of nicotine dependence: Secondary | ICD-10-CM | POA: Insufficient documentation

## 2018-08-17 NOTE — ED Provider Notes (Signed)
[facility] EMERGENCY DEPARTMENT Provider Note   CSN: [id number] Arrival date & time: 08/17/18  1326     History   Chief Complaint Chief Complaint  Patient presents with  . Arm Pain    HPI Mark Li is a 63 y.o. male.     The history is provided by the patient and medical records. No language interpreter was used.  Arm Pain Pertinent negatives include no chest pain and no shortness of breath.     63 year old male recently diagnosed with COVID-19 presenting for evaluation of pain to his right antecubital region.  Patient was seen on 6/13 with COVID symptoms and was hospitalized for 4 days, discharged on 6/17.  Last night patient developed pain prior to his right [facility] region radiates towards his right upper arm.  Pain is described as a throbbing sensation, worsening with palpation.  Rates pain as 2 out of 10.  No associated fever worsening chest pain shortness of breath productive cough or hemoptysis.  He did recall having IV access at that site.  He reached out to the on-call nurse the recommendation to come to ER for further evaluation.  He denies any specific treatment tried.  Past Medical History:  Diagnosis Date  . Hypertension   . Sleep disturbance 03/20/2013    Patient Active Problem List   Diagnosis Date Noted  . Acute respiratory disease due to COVID-19 virus 08/10/2018  . Essential hypertension 08/10/2018  . Sleep disturbance 03/20/2013    Past Surgical History:  Procedure Laterality Date  . APPENDECTOMY    . FRACTURE SURGERY          Home Medications    Prior to Admission medications   Medication Sig Start Date End Date Taking? Authorizing Provider  famotidine (PEPCID) 20 MG tablet Take 1 tablet (20 mg total) by mouth daily. 08/15/18   Elgergawy, Silver Huguenin, MD  lisinopril (ZESTRIL) 10 MG tablet Take 10 mg by mouth daily. 03/15/18   [provider]  methylPREDNISolone (MEDROL DOSEPAK) 4 MG TBPK tablet Take 4-24 mg by mouth See  admin instructions. Take 6 tablets (24 mg totally) by mouth in day 1; take 5 tablets (20 mg totally) in day 2; take 4 tablets (16 mg totally) in day 3; take 3 tablets (12 mg totally) in day 4; take 2 tablets (8 mg totally) in day 5; and 1 tablet (4 mg totally) in day 6. 08/10/18   [provider]  vitamin C (ASCORBIC ACID) 250 MG tablet Take 1 tablet (250 mg total) by mouth daily. Please take for 2 weeks 08/14/18   Elgergawy, Silver Huguenin, MD  Zinc 220 (50 Zn) MG CAPS Take 220 mg by mouth daily. Please take for 2 weeks 08/14/18   Elgergawy, Silver Huguenin, MD    Family History Family History  Problem Relation Age of Onset  . COPD Mother   . Heart disease Father     Social History Social History   Tobacco Use  . Smoking status: Former Research scientist (life sciences)  . Smokeless tobacco: Never Used  Substance Use Topics  . Alcohol use: Yes    Comment: 10 drinks (beer, wine, liquor) per week  . Drug use: No     Allergies   Sm antibiotic plus pain relief  [neomycin-polymyxin-pramoxine], Amoxicillin, Penicillins, and Zithromax [azithromycin dihydrate]   Review of Systems Review of Systems  Constitutional: Negative for fever.  Respiratory: Negative for shortness of breath.   Cardiovascular: Negative for chest pain.     Physical Exam  Updated Vital Signs BP 117/89 (BP Location: Left Arm)   Pulse 65   Temp 98.7 F (37.1 C)   Resp 18   Ht 5\' 11"  (1.803 m)   Wt 113.4 kg   SpO2 97%   BMI 34.87 kg/m   Physical Exam Vitals signs and nursing note reviewed.  Constitutional:      General: He is not in acute distress.    Appearance: He is well-developed.  HENT:     Head: Atraumatic.  Eyes:     Conjunctiva/sclera: Conjunctivae normal.  Neck:     Musculoskeletal: Neck supple.  Cardiovascular:     Rate and Rhythm: Normal rate and regular rhythm.     Pulses: Normal pulses.     Heart sounds: Normal heart sounds.  Pulmonary:     Effort: Pulmonary effort is normal.     Breath sounds: Normal breath  sounds. No wheezing.  Musculoskeletal:        General: Tenderness (Right arm: Small ecchymosis noted to the right AC from previous IV site.  Mild tenderness to palpation to the affected side extending towards the right medial bicep region without any significant lymphangitis or edema.) present.  Skin:    Findings: No rash.  Neurological:     Mental Status: He is alert.      ED Treatments / Results  Labs (all labs ordered are listed, but only abnormal results are displayed) Labs Reviewed - No data to display  EKG    Radiology No results found.  Procedures Procedures (including critical care time)  Medications Ordered in ED Medications - No data to display   Initial Impression / Assessment and Plan / ED Course  I have reviewed the triage vital signs and the nursing notes.  Pertinent labs & imaging results that were available during my care of the patient were reviewed by me and considered in my medical decision making (see chart for details).        BP 117/89 (BP Location: Left Arm)   Pulse 65   Temp 98.7 F (37.1 C)   Resp 18   Ht 5\' 11"  (1.803 m)   Wt 113.4 kg   SpO2 97%   BMI 34.87 kg/m    Final Clinical Impressions(s) / ED Diagnoses   Final diagnoses:  Thrombophlebitis of arm, right    ED Discharge Orders    None     2:25 PM Patient here with pain to his right [facility] region.  Was hospitalized recently for JHERD-40 complication and discharge several days ago.  The site of pain is likely due to superficial thrombophlebitis from recent Angiocath insertion.  Low suspicion for DVT.  Low suspicion for cellulitis or abscess.  Recommend warm compress, return precaution discussed.  Patient otherwise stable for discharge.  Mark Li was evaluated in Emergency Department on 08/17/2018 for the symptoms described in the history of present illness. He was evaluated in the context of the global COVID-19 pandemic, which necessitated consideration that the patient  might be at risk for infection with the SARS-CoV-2 virus that causes COVID-19. Institutional protocols and algorithms that pertain to the evaluation of patients at risk for COVID-19 are in a state of rapid change based on information released by regulatory bodies including the CDC and federal and state organizations. These policies and algorithms were followed during the patient's care in the ED.    Domenic Moras, PA-C 08/17/18 [place], [place], MD 08/19/18 [phone]

## 2018-08-17 NOTE — ED Triage Notes (Signed)
Patient arrives ambulatory c/o right upper arm pain and redness onset of this morning. Patient states he had an IV in his right [facility] when hospitalized and now having pain.

## 2019-04-09 ENCOUNTER — Encounter (HOSPITAL_COMMUNITY): Payer: Self-pay | Admitting: Emergency Medicine

## 2019-04-09 ENCOUNTER — Other Ambulatory Visit: Payer: Self-pay

## 2019-04-09 ENCOUNTER — Emergency Department (HOSPITAL_COMMUNITY): Payer: BC Managed Care – PPO

## 2019-04-09 ENCOUNTER — Emergency Department (HOSPITAL_COMMUNITY)
Admission: EM | Admit: 2019-04-09 | Discharge: 2019-04-10 | Disposition: A | Discharge status: Home / Self Care | Payer: BC Managed Care – PPO | Attending: Emergency Medicine | Admitting: Emergency Medicine

## 2019-04-09 DIAGNOSIS — Z7982 Long term (current) use of aspirin: Secondary | ICD-10-CM | POA: Insufficient documentation

## 2019-04-09 DIAGNOSIS — Z79899 Other long term (current) drug therapy: Secondary | ICD-10-CM | POA: Insufficient documentation

## 2019-04-09 DIAGNOSIS — M546 Pain in thoracic spine: Secondary | ICD-10-CM | POA: Insufficient documentation

## 2019-04-09 DIAGNOSIS — R109 Unspecified abdominal pain: Secondary | ICD-10-CM | POA: Diagnosis present

## 2019-04-09 DIAGNOSIS — I10 Essential (primary) hypertension: Secondary | ICD-10-CM | POA: Diagnosis not present

## 2019-04-09 DIAGNOSIS — Z8616 Personal history of COVID-19: Secondary | ICD-10-CM | POA: Diagnosis not present

## 2019-04-09 DIAGNOSIS — Z87891 Personal history of nicotine dependence: Secondary | ICD-10-CM | POA: Diagnosis not present

## 2019-04-09 DIAGNOSIS — J189 Pneumonia, unspecified organism: Secondary | ICD-10-CM | POA: Insufficient documentation

## 2019-04-09 LAB — CBC
HCT: 49.5 % (ref 39.0–52.0)
Hemoglobin: 16.9 g/dL (ref 13.0–17.0)
MCH: 32.4 pg (ref 26.0–34.0)
MCHC: 34.1 g/dL (ref 30.0–36.0)
MCV: 94.8 fL (ref 80.0–100.0)
Platelets: 209 10*3/uL (ref 150–400)
RBC: 5.22 MIL/uL (ref 4.22–5.81)
RDW: 11.9 % (ref 11.5–15.5)
WBC: 9.4 10*3/uL (ref 4.0–10.5)
nRBC: 0 % (ref 0.0–0.2)

## 2019-04-09 LAB — BASIC METABOLIC PANEL
Anion gap: 12 (ref 5–15)
BUN: 21 mg/dL (ref 8–23)
CO2: 25 mmol/L (ref 22–32)
Calcium: 9.3 mg/dL (ref 8.9–10.3)
Chloride: 102 mmol/L (ref 98–111)
Creatinine, Ser: 1.01 mg/dL (ref 0.61–1.24)
GFR calc Af Amer: >60 mL/min (ref >60)
GFR calc non Af Amer: >60 mL/min (ref >60)
Glucose, Bld: 102 mg/dL — ABNORMAL HIGH (ref 70–99)
Potassium: 4.5 mmol/L (ref 3.5–5.1)
Sodium: 139 mmol/L (ref 135–145)

## 2019-04-09 LAB — HEPATIC FUNCTION PANEL
ALT: 27 U/L (ref 0–44)
AST: 16 U/L (ref 15–41)
Albumin: 3.7 g/dL (ref 3.5–5.0)
Alkaline Phosphatase: 59 U/L (ref 38–126)
Bilirubin, Direct: 0.2 mg/dL (ref 0.0–0.2)
Indirect Bilirubin: 0.9 mg/dL (ref 0.3–0.9)
Total Bilirubin: 1.1 mg/dL (ref 0.3–1.2)
Total Protein: 6.9 g/dL (ref 6.5–8.1)

## 2019-04-09 LAB — URINALYSIS, ROUTINE W REFLEX MICROSCOPIC
Bacteria, UA: NONE SEEN
Bilirubin Urine: NEGATIVE
Glucose, UA: NEGATIVE mg/dL
Ketones, ur: 5 mg/dL — AB
Leukocytes,Ua: NEGATIVE
Nitrite: NEGATIVE
Protein, ur: NEGATIVE mg/dL
Specific Gravity, Urine: 1.026 (ref 1.005–1.030)
pH: 5 (ref 5.0–8.0)

## 2019-04-09 LAB — D-DIMER, QUANTITATIVE: D-Dimer, Quant: 1.46 ug/mL-FEU — ABNORMAL HIGH (ref 0.00–0.50)

## 2019-04-09 LAB — BRAIN NATRIURETIC PEPTIDE: B Natriuretic Peptide: 31.4 pg/mL (ref 0.0–100.0)

## 2019-04-09 LAB — TROPONIN I (HIGH SENSITIVITY)
Troponin I (High Sensitivity): 3 ng/L (ref <18)
Troponin I (High Sensitivity): <2 ng/L (ref <18)

## 2019-04-09 MED ORDER — MORPHINE SULFATE (PF) 4 MG/ML IV SOLN
4.0000 mg | Freq: Once | INTRAVENOUS | Status: AC
  Administered 2019-04-10: 4 mg via INTRAVENOUS
  Filled 2019-04-09: qty 1

## 2019-04-09 MED ORDER — ONDANSETRON HCL 4 MG/2ML IJ SOLN
4.0000 mg | Freq: Once | INTRAMUSCULAR | Status: AC
  Administered 2019-04-09: 4 mg via INTRAVENOUS
  Filled 2019-04-09: qty 2

## 2019-04-09 MED ORDER — KETOROLAC TROMETHAMINE 30 MG/ML IJ SOLN
30.0000 mg | Freq: Once | INTRAMUSCULAR | Status: AC
  Administered 2019-04-10: 30 mg via INTRAMUSCULAR
  Filled 2019-04-09: qty 1

## 2019-04-09 MED ORDER — DOXYCYCLINE HYCLATE 100 MG PO TABS
100.0000 mg | ORAL_TABLET | Freq: Once | ORAL | Status: AC
  Administered 2019-04-09: 100 mg via ORAL
  Filled 2019-04-09: qty 1

## 2019-04-09 MED ORDER — DOXYCYCLINE HYCLATE 100 MG PO CAPS: 100.0000 mg | ORAL_CAPSULE | Freq: Two times a day (BID) | ORAL | 0 refills | Status: AC

## 2019-04-09 MED ORDER — IOHEXOL 350 MG/ML SOLN
75.0000 mL | Freq: Once | INTRAVENOUS | Status: AC | PRN
  Administered 2019-04-09: 75 mL via INTRAVENOUS

## 2019-04-09 MED ORDER — CEFDINIR 300 MG PO CAPS: 300.0000 mg | ORAL_CAPSULE | Freq: Two times a day (BID) | ORAL | 0 refills | Status: DC

## 2019-04-09 MED ORDER — ONDANSETRON HCL 4 MG PO TABS: 4.0000 mg | ORAL_TABLET | Freq: Four times a day (QID) | ORAL | 0 refills | Status: DC

## 2019-04-09 MED ORDER — SODIUM CHLORIDE 0.9 % IV SOLN
1.0000 g | Freq: Once | INTRAVENOUS | Status: AC
  Administered 2019-04-09: 1 g via INTRAVENOUS
  Filled 2019-04-09: qty 10

## 2019-04-09 NOTE — ED Provider Notes (Signed)
[facility] EMERGENCY DEPARTMENT Provider Note   CSN: [id number] Arrival date & time: 04/09/19  1829     History Chief Complaint  Patient presents with  . Flank Pain    Mark Li is a 64 y.o. male.  HPI   Pt is a 64 y/o male with a h/o HTN, who presents to the ED today for eval of right right mid back pain for the last few days. He later developed sharp, stabbing pain today while at work. States that pain is worse with inspiration. The pain intermittently radiates to the right shoulder.Since pain started to have a cough. He denies SOB. Denies associated nausea, vomiting, or abd discomfort. Denies that pain is worse with eating.   Denies ble swelling, hemoptysis, recent surgery/trauma, recent long travel, hormone use, personal hx of cancer, or hx of DVT/PE.   Pt had COVID 07/2018 and was admitted at the time.   Past Medical History:  Diagnosis Date  . Hypertension   . Sleep disturbance 03/20/2013    Patient Active Problem List   Diagnosis Date Noted  . Acute respiratory disease due to COVID-19 virus 08/10/2018  . Essential hypertension 08/10/2018  . Sleep disturbance 03/20/2013    Past Surgical History:  Procedure Laterality Date  . APPENDECTOMY    . FRACTURE SURGERY         Family History  Problem Relation Age of Onset  . COPD Mother   . Heart disease Father     Social History   Tobacco Use  . Smoking status: Former Research scientist (life sciences)  . Smokeless tobacco: Never Used  Substance Use Topics  . Alcohol use: Yes    Comment: 10 drinks (beer, wine, liquor) per week  . Drug use: No    Home Medications Prior to Admission medications   Medication Sig Start Date End Date Taking? Authorizing Provider  aspirin 81 MG chewable tablet Chew 324 mg by mouth as needed (for chest pain).    Yes [provider]  lisinopril (ZESTRIL) 10 MG tablet Take 10 mg by mouth daily. 03/15/18  Yes [provider]  meloxicam (MOBIC) 15 MG tablet Take 15 mg  by mouth daily.   Yes [provider]  PRESCRIPTION MEDICATION CPAP: At bedtime   Yes [provider]  cefdinir (OMNICEF) 300 MG capsule Take 1 capsule (300 mg total) by mouth 2 (two) times daily. 04/09/19   Eliyanah Elgersma S, PA-C  doxycycline (VIBRAMYCIN) 100 MG capsule Take 1 capsule (100 mg total) by mouth 2 (two) times daily for 7 days. 04/09/19 04/16/19  Tyrie Porzio S, PA-C  famotidine (PEPCID) 20 MG tablet Take 1 tablet (20 mg total) by mouth daily. Patient not taking: Reported on 04/09/2019 08/15/18   Elgergawy, Silver Huguenin, MD  methylPREDNISolone (MEDROL DOSEPAK) 4 MG TBPK tablet Take 4-24 mg by mouth See admin instructions. Take 6 tablets (24 mg totally) by mouth in day 1; take 5 tablets (20 mg totally) in day 2; take 4 tablets (16 mg totally) in day 3; take 3 tablets (12 mg totally) in day 4; take 2 tablets (8 mg totally) in day 5; and 1 tablet (4 mg totally) in day 6. 08/10/18   [provider]  ondansetron (ZOFRAN) 4 MG tablet Take 1 tablet (4 mg total) by mouth every 6 (six) hours. 04/09/19   Sage Hammill S, PA-C  vitamin C (ASCORBIC ACID) 250 MG tablet Take 1 tablet (250 mg total) by mouth daily. Please take for 2 weeks Patient not  taking: Reported on 04/09/2019 08/14/18   Elgergawy, Silver Huguenin, MD  Zinc 220 (50 Zn) MG CAPS Take 220 mg by mouth daily. Please take for 2 weeks Patient not taking: Reported on 04/09/2019 08/14/18   Elgergawy, Silver Huguenin, MD    Allergies    Amoxicillin, Other, Penicillins, Vitamin c, Zinc, and Zithromax [azithromycin dihydrate]  Review of Systems   Review of Systems  Constitutional: Negative for fever.  HENT: Negative for ear pain and sore throat.   Eyes: Negative for visual disturbance.  Respiratory: Positive for cough. Negative for shortness of breath.        Pain with inspiration  Cardiovascular: Positive for chest pain.  Gastrointestinal: Positive for abdominal pain. Negative for constipation, diarrhea, nausea and vomiting.    Genitourinary: Negative for dysuria and hematuria.  Musculoskeletal: Negative for back pain.  Skin: Negative for rash.  Neurological: Negative for syncope and headaches.  All other systems reviewed and are negative.   Physical Exam Updated Vital Signs BP 112/75   Pulse 69   Temp 98 F (36.7 C) (Oral)   Resp 19   SpO2 97%   Physical Exam Vitals and nursing note reviewed.  Constitutional:      Appearance: He is well-developed.  HENT:     Head: Normocephalic and atraumatic.  Eyes:     Conjunctiva/sclera: Conjunctivae normal.  Cardiovascular:     Rate and Rhythm: Normal rate and regular rhythm.     Pulses: Normal pulses.     Heart sounds: Normal heart sounds. No murmur.  Pulmonary:     Effort: Pulmonary effort is normal. No respiratory distress.     Breath sounds: Normal breath sounds. No wheezing, rhonchi or rales.  Chest:     Chest wall: No tenderness.  Abdominal:     General: Bowel sounds are normal.     Palpations: Abdomen is soft.     Tenderness: There is no abdominal tenderness. There is no right CVA tenderness, left CVA tenderness, guarding or rebound.  Musculoskeletal:     Cervical back: Neck supple.     Comments: 1+ BLE edema, no calf pain. No TTP to the right mid back. No rashes  Skin:    General: Skin is warm and dry.  Neurological:     Mental Status: He is alert.     ED Results / Procedures / Treatments   Labs (all labs ordered are listed, but only abnormal results are displayed) Labs Reviewed  URINALYSIS, ROUTINE W REFLEX MICROSCOPIC - Abnormal; Notable for the following components:      Result Value   Hgb urine dipstick SMALL (*)    Ketones, ur 5 (*)    All other components within normal limits  BASIC METABOLIC PANEL - Abnormal; Notable for the following components:   Glucose, Bld 102 (*)    All other components within normal limits  D-DIMER, QUANTITATIVE (NOT AT [facility]) - Abnormal; Notable for the following components:   D-Dimer, Quant 1.46 (*)     All other components within normal limits  CBC  HEPATIC FUNCTION PANEL  BRAIN NATRIURETIC PEPTIDE  TROPONIN I (HIGH SENSITIVITY)  TROPONIN I (HIGH SENSITIVITY)    EKG None  Radiology CT Angio Chest PE W and/or Wo Contrast  Result Date: 04/09/2019 CLINICAL DATA:  Sudden onset right-sided flank pain, chest pain EXAM: CT ANGIOGRAPHY CHEST WITH CONTRAST TECHNIQUE: Multidetector CT imaging of the chest was performed using the standard protocol during bolus administration of intravenous contrast. Multiplanar CT image reconstructions and MIPs were obtained  to evaluate the vascular anatomy. CONTRAST:  92mL OMNIPAQUE IOHEXOL 350 MG/ML SOLN COMPARISON:  None. FINDINGS: Cardiovascular: There is a optimal opacification of the pulmonary arteries. There is no central,segmental, or subsegmental filling defects within the pulmonary arteries. The heart is normal in size. No pericardial effusion or thickening. No evidence right heart strain. There is normal three-vessel brachiocephalic anatomy without proximal stenosis. The thoracic aorta is normal in appearance. Minimal scattered aortic atherosclerosis is noted. Mediastinum/Nodes: No hilar, mediastinal, or axillary adenopathy. Thyroid gland, trachea, and esophagus demonstrate no significant findings. Lungs/Pleura: Patchy ground-glass opacity seen at the posterior periphery of the right lung base. No pleural effusion or pneumothorax. No airspace consolidation. Upper Abdomen: No acute abnormalities present in the visualized portions of the upper abdomen. Musculoskeletal: No chest wall abnormality. No acute or significant osseous findings. Review of the MIP images confirms the above findings. IMPRESSION: No central, segmental, or subsegmental pulmonary embolism. Patchy ground-glass opacities throughout the periphery of the right lung base, likely consistent with multifocal pneumonia. Electronically Signed   By: Prudencio Pair M.D.   On: 04/09/2019 22:52   DG Chest  Portable 1 View  Result Date: 04/09/2019 CLINICAL DATA:  ED PORT Patient arrives ambulatory c/o right upper arm pain and redness onset of this morning. Patient states he had an IV in his right [facility] when hospitalized and now having pain. EXAM: PORTABLE CHEST 1 VIEW COMPARISON:  Chest radiograph 08/10/2018 FINDINGS: Stable cardiomediastinal contours. Low lung volumes. There are new mild opacities in the bilateral lung bases likely atelectasis, infection not excluded. No pneumothorax or large pleural effusion. No acute finding in the visualized skeleton. IMPRESSION: Low lung volumes with new mild opacities in the bilateral lung bases likely atelectasis, infection not excluded. Electronically Signed   By: Audie Pinto M.D.   On: 04/09/2019 20:52    Procedures Procedures (including critical care time)  Medications Ordered in ED Medications  ketorolac (TORADOL) 30 MG/ML injection 30 mg (has no administration in time range)  morphine 4 MG/ML injection 4 mg (has no administration in time range)  iohexol (OMNIPAQUE) 350 MG/ML injection 75 mL (75 mLs Intravenous Contrast Given 04/09/19 2233)  cefTRIAXone (ROCEPHIN) 1 g in sodium chloride 0.9 % 100 mL IVPB (1 g Intravenous New Bag/Given 04/09/19 2324)  doxycycline (VIBRA-TABS) tablet 100 mg (100 mg Oral Given 04/09/19 2322)  ondansetron (ZOFRAN) injection 4 mg (4 mg Intravenous Given 04/09/19 2322)    ED Course  I have reviewed the triage vital signs and the nursing notes.  Pertinent labs & imaging results that were available during my care of the patient were reviewed by me and considered in my medical decision making (see chart for details).    MDM Rules/Calculators/A&P                      64 year old male presenting for evaluation of right mid back pain that started 3 days ago but worsened tonight.  Has pain with inspiration.  He also started coughing today.  No hemoptysis.  No reported PE risk factors except that he did have Covid in June  2020.  Vital signs reassuring.  Exam benign  Reviewed labs Cbc wnl Bmp wnl Liver enzymes wnl BNP wnl Trop neg x2 DDimer positive  - will get cta chest UA neg  EKG with NSR, left atrial enlargement  CXR with low lung volumes with new mild opacities in the bilateral lung bases likely atelectasis, infection not excluded.  - personally reviewed and I agree with  radiologist  CTA chest w/o PE.  Patchy ground-glass opacities throughout the periphery of the right lung base, likely consistent with multifocal pneumonia.  - personally reviewed and I agree with radiologist  Pt reassessed. He is still satting well on RA. Discussed results and plan for d/c on abx. Advised close pcp f/u and strict return precautions. He voices understanding and is in agreement with plan. All questions answered, pt stable for d/c.   Final Clinical Impression(s) / ED Diagnoses Final diagnoses:  Community acquired pneumonia of right lung, unspecified part of lung    Rx / DC Orders ED Discharge Orders         Ordered    cefdinir (OMNICEF) 300 MG capsule  2 times daily     04/09/19 2351    doxycycline (VIBRAMYCIN) 100 MG capsule  2 times daily     04/09/19 2351    ondansetron (ZOFRAN) 4 MG tablet  Every 6 hours     04/09/19 2351           Rodney Booze, PA-C 04/09/19 2356    Charlesetta Shanks, MD 04/11/19 1606

## 2019-04-09 NOTE — ED Notes (Signed)
Sent urine culture with specimen 

## 2019-04-09 NOTE — Discharge Instructions (Addendum)
You were given a prescription for antibiotics. Please take the antibiotic prescription fully. You were given nausea medications in case the antibiotics cause nausea.  You can rotate tylenol and motrin to help with the pain.  Please follow up with your primary care provider within 3-5 days for re-evaluation of your symptoms.   Please return to the emergency department for any new or worsening symptoms.

## 2019-04-09 NOTE — ED Triage Notes (Signed)
Pt c/o sudden onset right sided flank pain, worse with inspiration. Denies fall/trauma, denies urinary symptoms.

## 2020-01-13 DIAGNOSIS — G8929 Other chronic pain: Secondary | ICD-10-CM | POA: Insufficient documentation

## 2021-03-23 IMAGING — DX DG CHEST 1V PORT
1 series · 1 of 1 positions shown · non-contrast
Comparison: Chest radiograph 08/10/2018

CLINICAL DATA: ED PORT Patient arrives ambulatory c/o right upper
arm pain and redness onset of this morning. Patient states he had an
IV in his right AC when hospitalized and now having pain.

EXAM:
PORTABLE CHEST 1 VIEW

[chest]
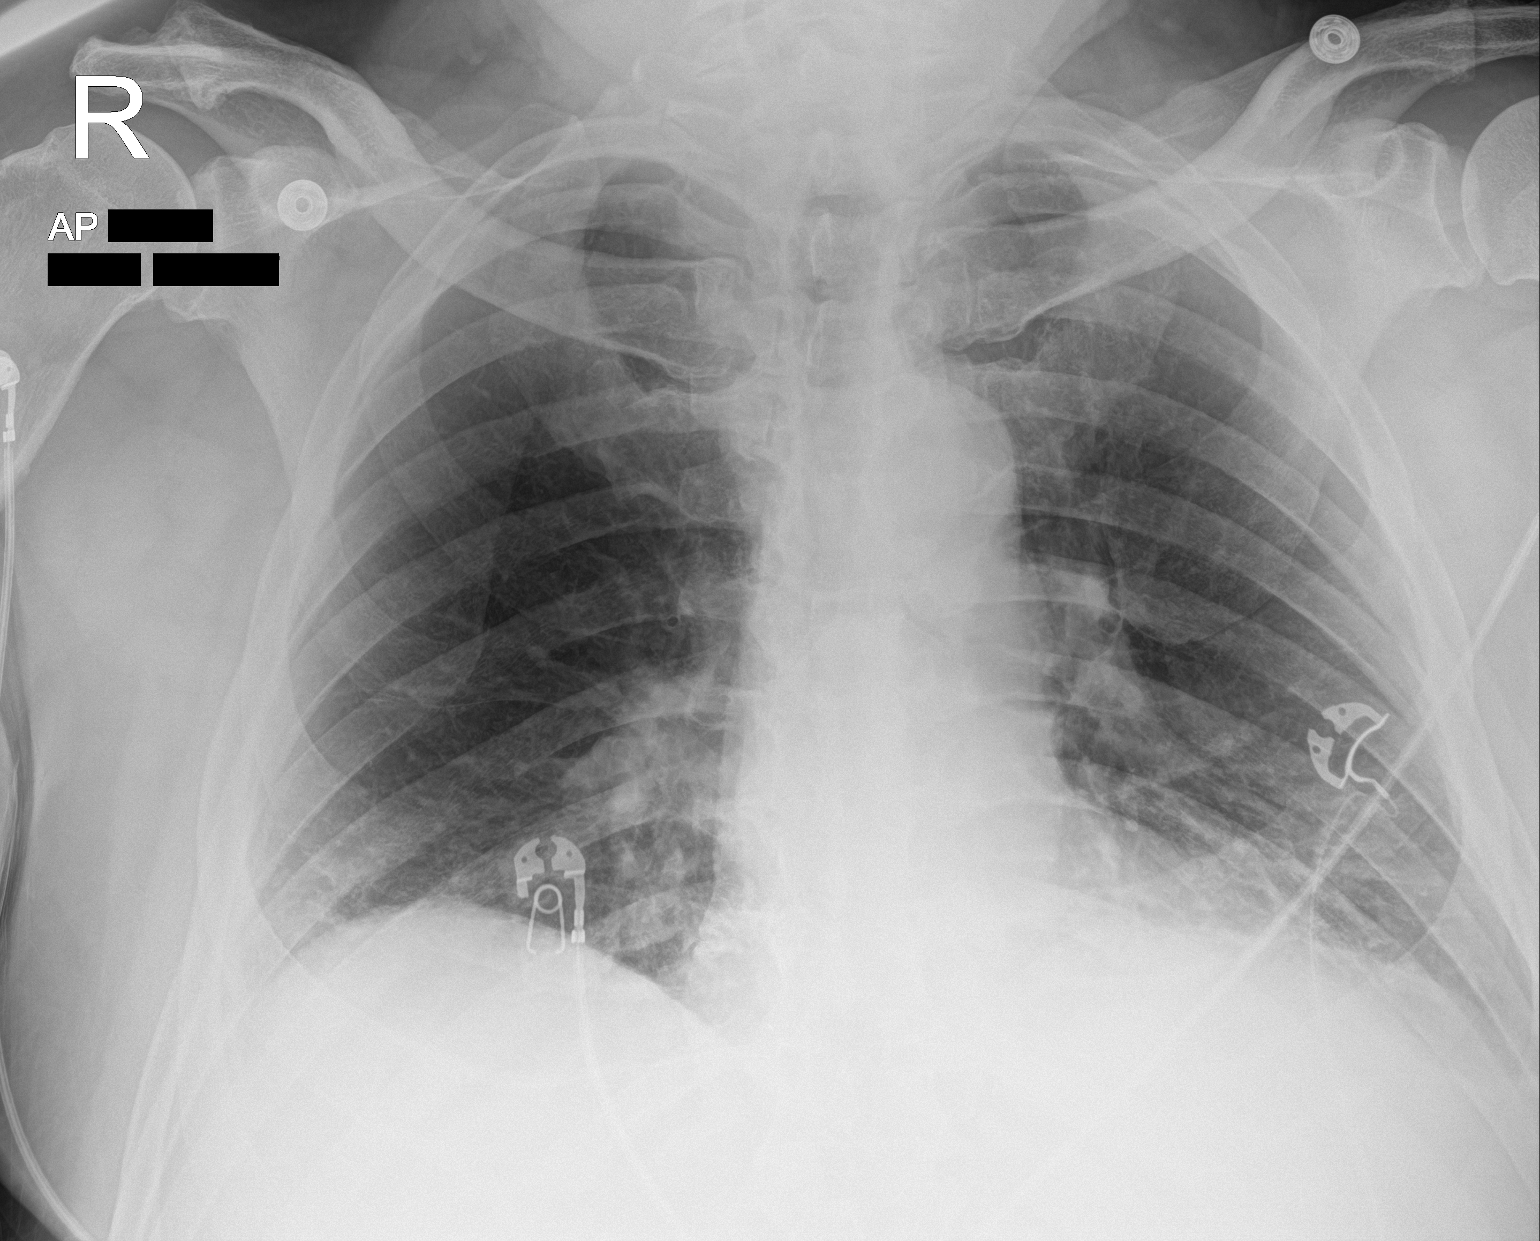

[1 of 1 positions shown; findings below may reference images not displayed]

FINDINGS: Stable cardiomediastinal contours. Low lung volumes. There are new
mild opacities in the bilateral lung bases likely atelectasis,
infection not excluded. No pneumothorax or large pleural effusion.
No acute finding in the visualized skeleton.
IMPRESSION: Low lung volumes with new mild opacities in the bilateral lung bases
likely atelectasis, infection not excluded.

## 2021-03-23 IMAGING — CT CT ANGIO CHEST
2 of 6 series · 18 of 36 positions shown · IV contrast (omnipaque)
Comparison: None.

CLINICAL DATA: Sudden onset right-sided flank pain, chest pain

EXAM:
CT ANGIOGRAPHY CHEST WITH CONTRAST
TECHNIQUE: Multidetector CT imaging of the chest was performed using the
standard protocol during bolus administration of intravenous
contrast. Multiplanar CT image reconstructions and MIPs were
obtained to evaluate the vascular anatomy.
CONTRAST:  75mL OMNIPAQUE IOHEXOL 350 MG/ML SOLN

[Series 7: pe thins · axial · 0.95mm/px · z∈[-125,+134]mm · 17 of 412 slices shown]
[im 21/412  lung]
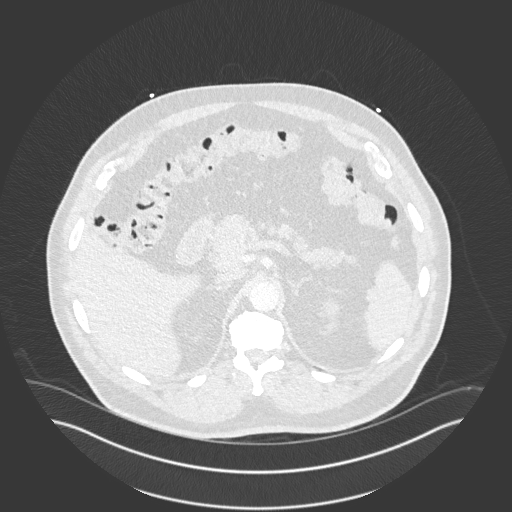
[im 42/412  mediastinal]
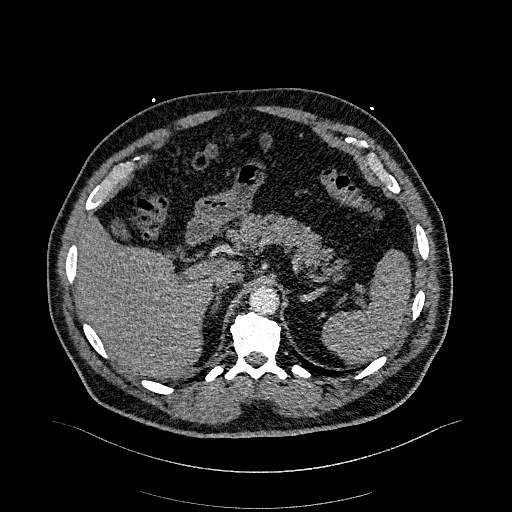
[im 62/412  lung]
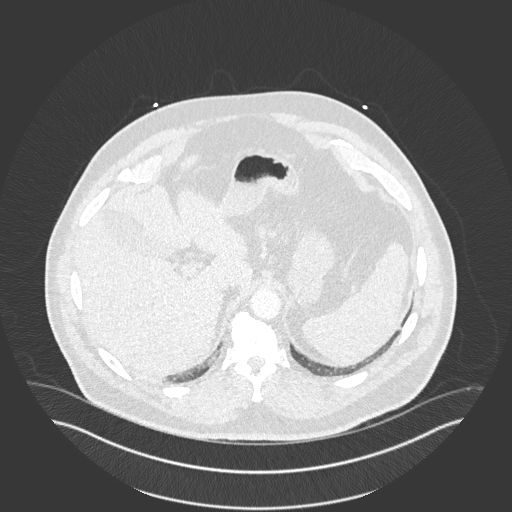
[im 83/412  mediastinal]
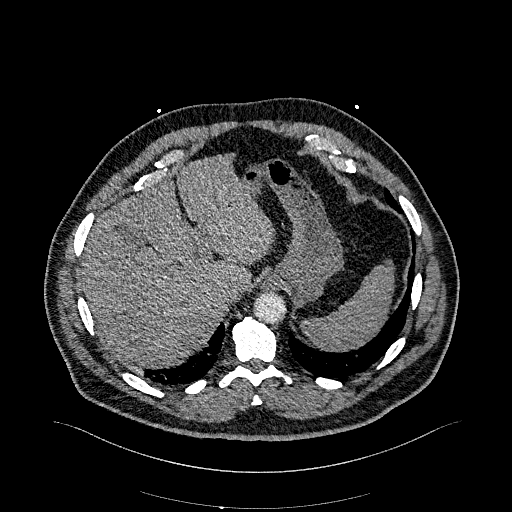
[im 124/412  lung]
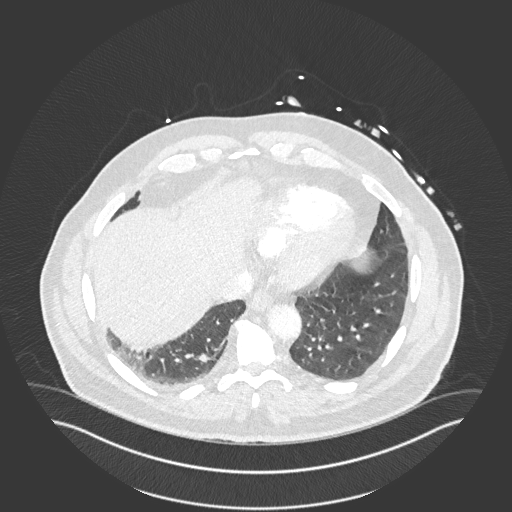
[im 144/412  mediastinal]
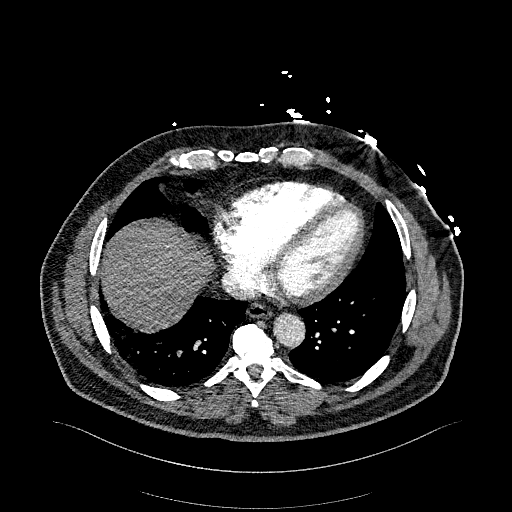
[im 165/412  lung]
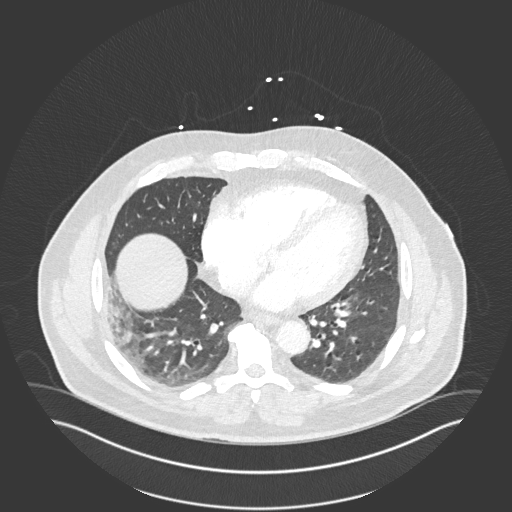
[im 185/412  mediastinal]
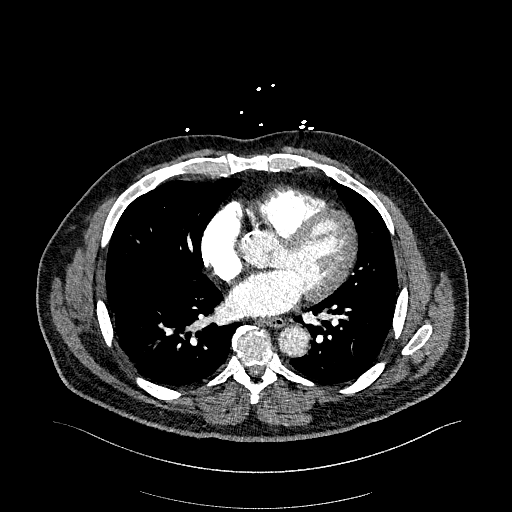
[im 206/412  lung]
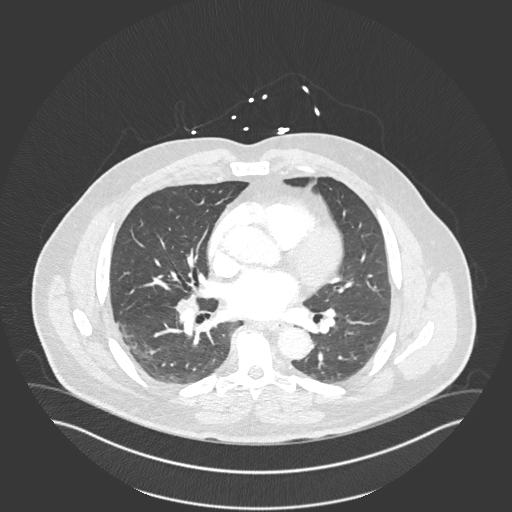
[im 227/412  mediastinal]
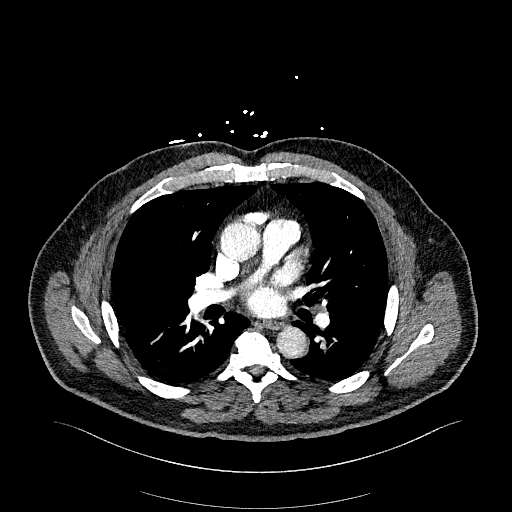
[im 247/412  lung]
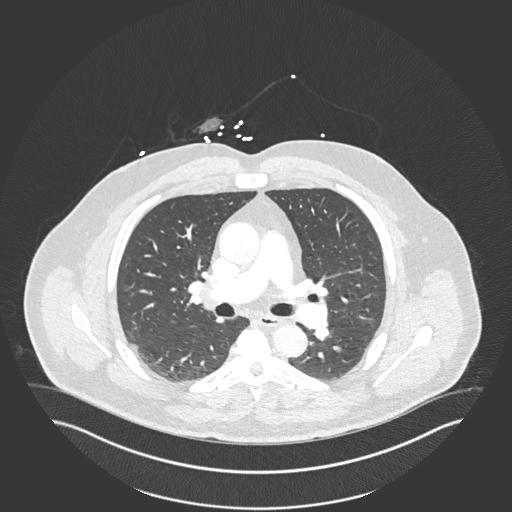
[im 268/412  mediastinal]
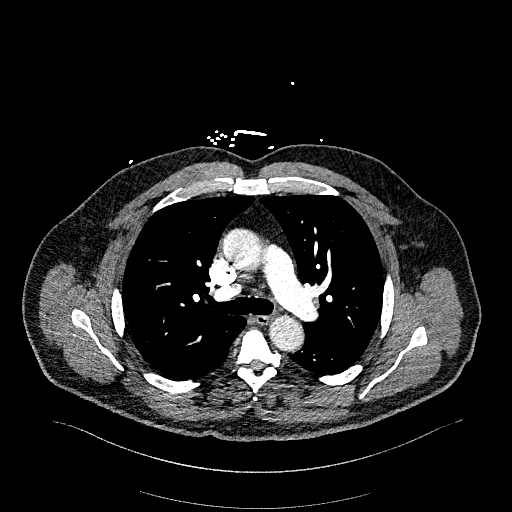
[im 288/412  lung]
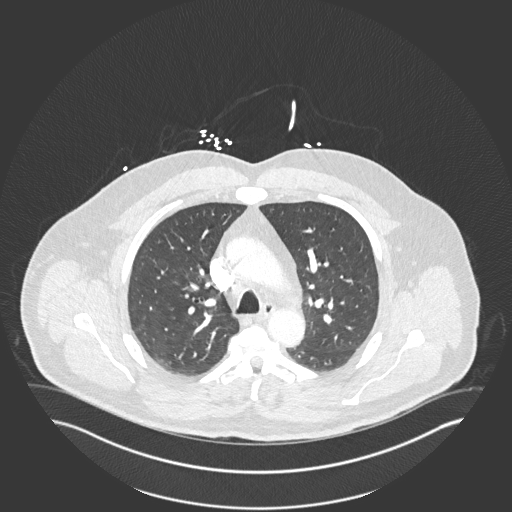
[im 329/412  mediastinal]
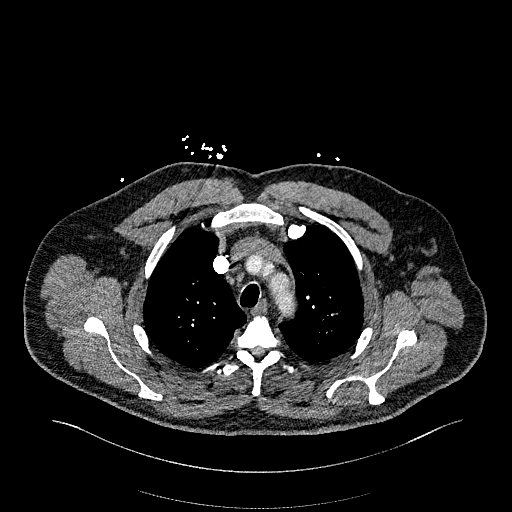
[im 350/412  lung]
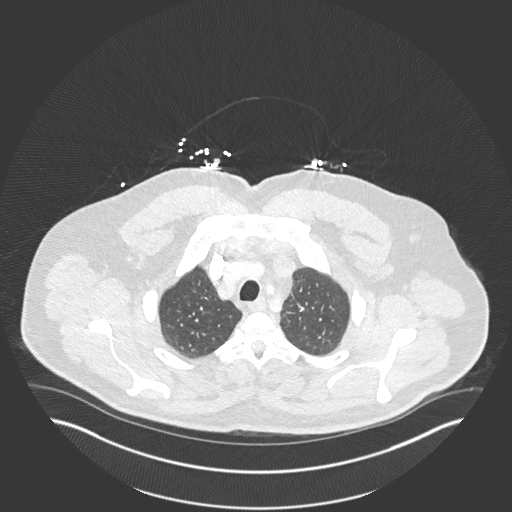
[im 370/412  mediastinal]
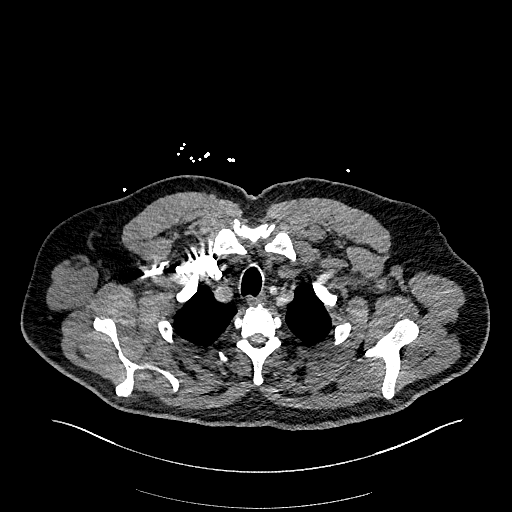
[im 391/412  lung]
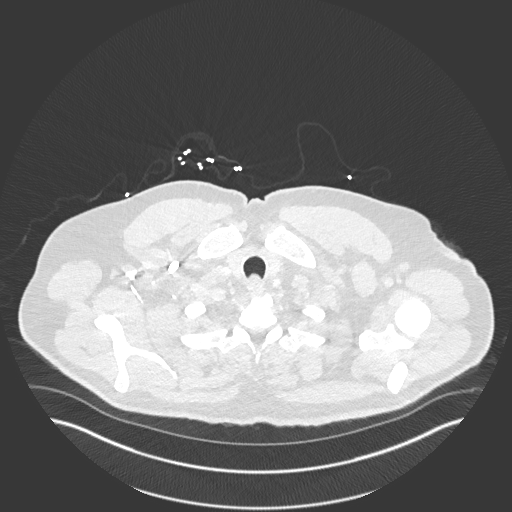

[Series 8: pe 2mm cor · coronal · 0.58mm/px · 1 of 141 slices shown]
[im 71/141  mediastinal]
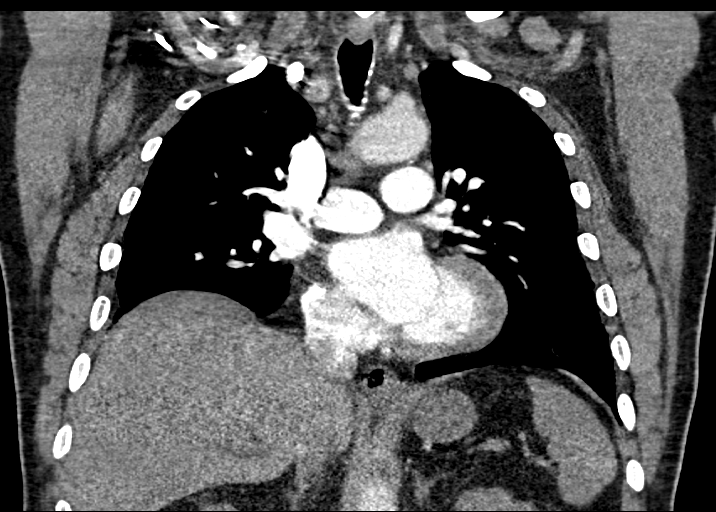

[18 of 36 positions shown; findings below may reference images not displayed]

FINDINGS: Cardiovascular: There is a optimal opacification of the pulmonary
arteries. There is no central,segmental, or subsegmental filling
defects within the pulmonary arteries. The heart is normal in size.
No pericardial effusion or thickening. No evidence right heart
strain. There is normal three-vessel brachiocephalic anatomy without
proximal stenosis. The thoracic aorta is normal in appearance.
Minimal scattered aortic atherosclerosis is noted.

Mediastinum/Nodes: No hilar, mediastinal, or axillary adenopathy.
Thyroid gland, trachea, and esophagus demonstrate no significant
findings.

Lungs/Pleura: Patchy ground-glass opacity seen at the posterior
periphery of the right lung base. No pleural effusion or
pneumothorax. No airspace consolidation.

Upper Abdomen: No acute abnormalities present in the visualized
portions of the upper abdomen.

Musculoskeletal: No chest wall abnormality. No acute or significant
osseous findings.

Review of the MIP images confirms the above findings.
IMPRESSION: No central, segmental, or subsegmental pulmonary embolism.

Patchy ground-glass opacities throughout the periphery of the right
lung base, likely consistent with multifocal pneumonia.

## 2021-07-05 ENCOUNTER — Emergency Department (HOSPITAL_COMMUNITY)
Admission: EM | Admit: 2021-07-05 | Discharge: 2021-07-05 | Disposition: A | Discharge status: Home / Self Care | Payer: Medicare Other | Attending: Emergency Medicine | Admitting: Emergency Medicine

## 2021-07-05 ENCOUNTER — Other Ambulatory Visit: Payer: Self-pay

## 2021-07-05 ENCOUNTER — Emergency Department (HOSPITAL_COMMUNITY): Payer: Medicare Other

## 2021-07-05 ENCOUNTER — Encounter (HOSPITAL_COMMUNITY): Payer: Self-pay

## 2021-07-05 DIAGNOSIS — Z79899 Other long term (current) drug therapy: Secondary | ICD-10-CM | POA: Insufficient documentation

## 2021-07-05 DIAGNOSIS — N23 Unspecified renal colic: Secondary | ICD-10-CM | POA: Insufficient documentation

## 2021-07-05 DIAGNOSIS — Z7982 Long term (current) use of aspirin: Secondary | ICD-10-CM | POA: Insufficient documentation

## 2021-07-05 DIAGNOSIS — R109 Unspecified abdominal pain: Secondary | ICD-10-CM | POA: Diagnosis present

## 2021-07-05 LAB — LIPASE, BLOOD: Lipase: 72 U/L — ABNORMAL HIGH (ref 11–51)

## 2021-07-05 LAB — COMPREHENSIVE METABOLIC PANEL
ALT: 32 U/L (ref 0–44)
AST: 19 U/L (ref 15–41)
Albumin: 3.2 g/dL — ABNORMAL LOW (ref 3.5–5.0)
Alkaline Phosphatase: 50 U/L (ref 38–126)
Anion gap: 11 (ref 5–15)
BUN: 29 mg/dL — ABNORMAL HIGH (ref 8–23)
CO2: 20 mmol/L — ABNORMAL LOW (ref 22–32)
Calcium: 8.1 mg/dL — ABNORMAL LOW (ref 8.9–10.3)
Chloride: 108 mmol/L (ref 98–111)
Creatinine, Ser: 1.23 mg/dL (ref 0.61–1.24)
GFR, Estimated: >60 mL/min (ref >60)
Glucose, Bld: 112 mg/dL — ABNORMAL HIGH (ref 70–99)
Potassium: 3.7 mmol/L (ref 3.5–5.1)
Sodium: 139 mmol/L (ref 135–145)
Total Bilirubin: 1 mg/dL (ref 0.3–1.2)
Total Protein: 5.8 g/dL — ABNORMAL LOW (ref 6.5–8.1)

## 2021-07-05 LAB — CBC WITH DIFFERENTIAL/PLATELET
Abs Immature Granulocytes: 0.07 10*3/uL (ref 0.00–0.07)
Basophils Absolute: 0.1 10*3/uL (ref 0.0–0.1)
Basophils Relative: 1 %
Eosinophils Absolute: 0.2 10*3/uL (ref 0.0–0.5)
Eosinophils Relative: 4 %
HCT: 46.9 % (ref 39.0–52.0)
Hemoglobin: 15.7 g/dL (ref 13.0–17.0)
Immature Granulocytes: 1 %
Lymphocytes Relative: 27 %
Lymphs Abs: 1.6 10*3/uL (ref 0.7–4.0)
MCH: 31.7 pg (ref 26.0–34.0)
MCHC: 33.5 g/dL (ref 30.0–36.0)
MCV: 94.7 fL (ref 80.0–100.0)
Monocytes Absolute: 0.7 10*3/uL (ref 0.1–1.0)
Monocytes Relative: 12 %
Neutro Abs: 3.2 10*3/uL (ref 1.7–7.7)
Neutrophils Relative %: 55 %
Platelets: 172 10*3/uL (ref 150–400)
RBC: 4.95 MIL/uL (ref 4.22–5.81)
RDW: 13.1 % (ref 11.5–15.5)
WBC: 5.8 10*3/uL (ref 4.0–10.5)
nRBC: 0 % (ref 0.0–0.2)

## 2021-07-05 MED ORDER — TAMSULOSIN HCL 0.4 MG PO CAPS: 0.4000 mg | ORAL_CAPSULE | Freq: Every day | ORAL | 0 refills | Status: DC

## 2021-07-05 MED ORDER — KETOROLAC TROMETHAMINE 30 MG/ML IJ SOLN
15.0000 mg | Freq: Once | INTRAMUSCULAR | Status: AC
  Administered 2021-07-05: 15 mg via INTRAVENOUS
  Filled 2021-07-05: qty 1

## 2021-07-05 MED ORDER — HYDROMORPHONE HCL 1 MG/ML IJ SOLN
1.0000 mg | Freq: Once | INTRAMUSCULAR | Status: AC
  Administered 2021-07-05: 1 mg via INTRAVENOUS
  Filled 2021-07-05: qty 1

## 2021-07-05 MED ORDER — OXYCODONE-ACETAMINOPHEN 5-325 MG PO TABS: 1.0000 | ORAL_TABLET | ORAL | 0 refills | Status: DC | PRN

## 2021-07-05 MED ORDER — ONDANSETRON 4 MG PO TBDP: ORAL_TABLET | ORAL | 0 refills | Status: DC

## 2021-07-05 NOTE — Discharge Instructions (Signed)
Call [facility] this morning to be worked in for an office visit today or tomorrow.  If your pain becomes uncontrolled, go to [facility] emergency department. ?

## 2021-07-05 NOTE — ED Triage Notes (Signed)
Pt BIB [facility] from home c/o L lower back pain and nausea that woke him from sleep at 0100. Pt stated this pain is similar to when he had pneumonia. EMS gave 173mg fentanyl and '4mg'$  zofran with little relief. Pt A&Ox4  ?

## 2021-07-05 NOTE — ED Provider Notes (Signed)
?[name] ?Provider Note ? ? ?CSN: [id number] ?Arrival date & time:    ? ?  ? ?History ? ?Chief Complaint  ?Patient presents with  ? Back Pain  ? Nausea  ? ? ?Mark Li is a 66 y.o. male. ? ?Patient presents to the emergency department for evaluation of left lower back and flank pain.  Patient reports being awakened from sleep at 1 AM by severe, constant pain.  Patient experiencing associated nausea, no vomiting. ? ? ?  ? ?Home Medications ?Prior to Admission medications   ?Medication Sig Start Date End Date Taking? Authorizing Provider  ?ondansetron (ZOFRAN-ODT) 4 MG disintegrating tablet '4mg'$  ODT q4 hours prn nausea/vomit 07/05/21  Yes Sharlynn Seckinger, Gwenyth Allegra, MD  ?oxyCODONE-acetaminophen (PERCOCET) 5-325 MG tablet Take 1-2 tablets by mouth every 4 (four) hours as needed. 07/05/21  Yes Jakari Jacot, Gwenyth Allegra, MD  ?tamsulosin (FLOMAX) 0.4 MG CAPS capsule Take 1 capsule (0.4 mg total) by mouth daily. 07/05/21  Yes Jeanee Fabre, Gwenyth Allegra, MD  ?aspirin 81 MG chewable tablet Chew 324 mg by mouth as needed (for chest pain).     [provider]  ?famotidine (PEPCID) 20 MG tablet Take 1 tablet (20 mg total) by mouth daily. ?Patient not taking: Reported on 04/09/2019 08/15/18   Elgergawy, Silver Huguenin, MD  ?lisinopril (ZESTRIL) 10 MG tablet Take 10 mg by mouth daily. 03/15/18   [provider]  ?meloxicam (MOBIC) 15 MG tablet Take 15 mg by mouth daily.    [provider]  ?methylPREDNISolone (MEDROL DOSEPAK) 4 MG TBPK tablet Take 4-24 mg by mouth See admin instructions. Take 6 tablets (24 mg totally) by mouth in day 1; take 5 tablets (20 mg totally) in day 2; take 4 tablets (16 mg totally) in day 3; take 3 tablets (12 mg totally) in day 4; take 2 tablets (8 mg totally) in day 5; and 1 tablet (4 mg totally) in day 6. 08/10/18   [provider]  ?PRESCRIPTION MEDICATION CPAP: At bedtime    [provider]  ?vitamin C (ASCORBIC ACID) 250 MG tablet  Take 1 tablet (250 mg total) by mouth daily. Please take for 2 weeks ?Patient not taking: Reported on 04/09/2019 08/14/18   Elgergawy, Silver Huguenin, MD  ?Zinc 220 (50 Zn) MG CAPS Take 220 mg by mouth daily. Please take for 2 weeks ?Patient not taking: Reported on 04/09/2019 08/14/18   Elgergawy, Silver Huguenin, MD  ?   ? ?Allergies    ?Amoxicillin, Other, Penicillins, Vitamin c, Zinc, and Zithromax [azithromycin dihydrate]   ? ?Review of Systems   ?Review of Systems ? ?Physical Exam ?Updated Vital Signs ?BP 131/90   Pulse 70   Temp 98 ?F (36.7 ?C)   Resp 15   SpO2 95%  ?Physical Exam ?Vitals and nursing note reviewed.  ?Constitutional:   ?   General: He is in acute distress.  ?   Appearance: He is well-developed.  ?HENT:  ?   Head: Normocephalic and atraumatic.  ?   Mouth/Throat:  ?   Mouth: Mucous membranes are moist.  ?Eyes:  ?   General: Vision grossly intact. Gaze aligned appropriately.  ?   Extraocular Movements: Extraocular movements intact.  ?   Conjunctiva/sclera: Conjunctivae normal.  ?Cardiovascular:  ?   Rate and Rhythm: Normal rate and regular rhythm.  ?   Pulses: Normal pulses.  ?   Heart sounds: Normal heart sounds, S1 normal and S2 normal. No murmur heard. ?  No friction  rub. No gallop.  ?Pulmonary:  ?   Effort: Pulmonary effort is normal. No respiratory distress.  ?   Breath sounds: Normal breath sounds.  ?Abdominal:  ?   Palpations: Abdomen is soft.  ?   Tenderness: There is no abdominal tenderness. There is no guarding or rebound.  ?   Hernia: No hernia is present.  ?Musculoskeletal:     ?   General: No swelling.  ?   Cervical back: Full passive range of motion without pain, normal range of motion and neck supple. No pain with movement, spinous process tenderness or muscular tenderness. Normal range of motion.  ?   Right lower leg: No edema.  ?   Left lower leg: No edema.  ?Skin: ?   General: Skin is warm and dry.  ?   Capillary Refill: Capillary refill takes less than 2 seconds.  ?   Findings: No  ecchymosis, erythema, lesion or wound.  ?Neurological:  ?   Mental Status: He is alert and oriented to person, place, and time.  ?   GCS: GCS eye subscore is 4. GCS verbal subscore is 5. GCS motor subscore is 6.  ?   Cranial Nerves: Cranial nerves 2-12 are intact.  ?   Sensory: Sensation is intact.  ?   Motor: Motor function is intact. No weakness or abnormal muscle tone.  ?   Coordination: Coordination is intact.  ?Psychiatric:     ?   Mood and Affect: Mood normal.     ?   Speech: Speech normal.     ?   Behavior: Behavior normal.  ? ? ?ED Results / Procedures / Treatments   ?Labs ?(all labs ordered are listed, but only abnormal results are displayed) ?Labs Reviewed  ?COMPREHENSIVE METABOLIC PANEL - Abnormal; Notable for the following components:  ?    Result Value  ? CO2 20 (*)   ? Glucose, Bld 112 (*)   ? BUN 29 (*)   ? Calcium 8.1 (*)   ? Total Protein 5.8 (*)   ? Albumin 3.2 (*)   ? All other components within normal limits  ?LIPASE, BLOOD - Abnormal; Notable for the following components:  ? Lipase 72 (*)   ? All other components within normal limits  ?CBC WITH DIFFERENTIAL/PLATELET  ? ? ?EKG ?EKG Interpretation ? ?Date/Time:  Tuesday Jul 05 2021 04:58:52 EDT ?Ventricular Rate:  77 ?PR Interval:  160 ?QRS Duration: 100 ?QT Interval:  391 ?QTC Calculation: 443 ?R Axis:   -17 ?Text Interpretation: Sinus rhythm Probable left atrial enlargement Borderline left axis deviation Confirmed by Orpah Greek [phone]) on 07/05/2021 5:11:10 AM ? ?Radiology ?No results found. ? ?Procedures ?Procedures  ? ? ?Medications Ordered in ED ?Medications  ?HYDROmorphone (DILAUDID) injection 1 mg (1 mg Intravenous Given 07/05/21 0342)  ?HYDROmorphone (DILAUDID) injection 1 mg (1 mg Intravenous Given 07/05/21 0445)  ?ketorolac (TORADOL) 30 MG/ML injection 15 mg (15 mg Intravenous Given 07/05/21 0651)  ? ? ?ED Course/ Medical Decision Making/ A&P ?  ?                        ?Medical Decision Making ?Amount and/or Complexity of Data  Reviewed ?Labs: ordered. ?Radiology: ordered. ? ?Risk ?Prescription drug management. ? ? ?Presents to the emergency department for evaluation of sudden onset flank pain.  Patient found to have a large proximal ureteral stone by CT.  No signs of urinary infection.  Patient pain-free after treatment.  Discussed with  on-call urology, patient to call office today to be worked in today or tomorrow for follow-up, as it is suspected that he will not pass the stone.  Return to [facility] emergency department for uncontrolled pain. ? ? ? ? ? ? ? ?Final Clinical Impression(s) / ED Diagnoses ?Final diagnoses:  ?Ureteral colic  ? ? ?Rx / DC Orders ?ED Discharge Orders   ? ?      Ordered  ?  oxyCODONE-acetaminophen (PERCOCET) 5-325 MG tablet  Every 4 hours PRN       ? 07/05/21 0640  ?  tamsulosin (FLOMAX) 0.4 MG CAPS capsule  Daily       ? 07/05/21 0640  ?  ondansetron (ZOFRAN-ODT) 4 MG disintegrating tablet       ? 07/05/21 0640  ? ?  ?  ? ?  ? ? ?  ?Orpah Greek, MD ?07/07/21 [phone] ? ?

## 2021-07-07 ENCOUNTER — Other Ambulatory Visit: Payer: Self-pay | Admitting: Urology

## 2021-07-15 ENCOUNTER — Encounter (HOSPITAL_BASED_OUTPATIENT_CLINIC_OR_DEPARTMENT_OTHER): Admission: RE | Payer: Self-pay | Source: Home / Self Care

## 2021-07-15 ENCOUNTER — Ambulatory Visit (HOSPITAL_BASED_OUTPATIENT_CLINIC_OR_DEPARTMENT_OTHER): Admission: RE | Admit: 2021-07-15 | Payer: Medicare Other | Source: Home / Self Care | Admitting: Urology

## 2021-07-15 SURGERY — CYSTOSCOPY/URETEROSCOPY/HOLMIUM LASER/STENT PLACEMENT
Anesthesia: General | Laterality: Left

## 2022-01-17 DIAGNOSIS — E785 Hyperlipidemia, unspecified: Secondary | ICD-10-CM | POA: Insufficient documentation

## 2022-01-17 DIAGNOSIS — G8929 Other chronic pain: Secondary | ICD-10-CM | POA: Insufficient documentation

## 2022-02-28 ENCOUNTER — Other Ambulatory Visit: Payer: Self-pay | Admitting: Urology

## 2022-02-28 ENCOUNTER — Other Ambulatory Visit (HOSPITAL_COMMUNITY): Payer: Self-pay | Admitting: Urology

## 2022-02-28 DIAGNOSIS — R972 Elevated prostate specific antigen [PSA]: Secondary | ICD-10-CM

## 2022-02-28 MED ORDER — FLEET ENEMA 7-19 GM/118ML RE ENEM: 1.0000 | ENEMA | Freq: Once | RECTAL | Status: AC

## 2022-03-17 ENCOUNTER — Encounter (HOSPITAL_COMMUNITY): Payer: Self-pay

## 2022-03-17 NOTE — Progress Notes (Addendum)
PCP - [facility]  Claudia Pollock, PA Cardiologist - no  PPM/ICD -  Device Orders -  Rep Notified -   Chest x-ray -  EKG - 07-05-21 epic Stress Test -  ECHO -  Cardiac Cath -   Sleep Study -  CPAP -   Fasting Blood Sugar -  Checks Blood Sugar _____ times a day  Blood Thinner Instructions: Aspirin Instructions:  ERAS Protcol -N/A PRE-SURGERY    COVID vaccine -yes  Monjauro - Hold 7 days prior last dose 03-21-22 pt. Had already taken dose 03-20-22 before preop  Activity--Able to climb a flight of stairs without SOB or CP  Anesthesia review: HTN, OSA   Patient denies shortness of breath, fever, cough and chest pain at PAT appointment   All instructions explained to the patient, with a verbal understanding of the material. Patient agrees to go over the instructions while at home for a better understanding. Patient also instructed to self quarantine after being tested for COVID-19. The opportunity to ask questions was provided.

## 2022-03-17 NOTE — Patient Instructions (Signed)
SURGICAL WAITING ROOM VISITATION  Patients having surgery or a procedure may have no more than 2 support people in the waiting area - these visitors may rotate.    Children under the age of 108 must have an adult with them who is not the patient.  Due to an increase in RSV and influenza rates and associated hospitalizations, children ages 14 and under may not visit patients in [place].  If the patient needs to stay at the hospital during part of their recovery, the visitor guidelines for inpatient rooms apply. Pre-op nurse will coordinate an appropriate time for 1 support person to accompany patient in pre-op.  This support person may not rotate.    Please refer to the [facility] website for the visitor guidelines for Inpatients (after your surgery is over and you are in a regular room).       Your procedure is scheduled on: 03-28-22   Report to [facility] Main Entrance    Report to admitting at     [place] AM   Call this number if you have problems the morning of surgery [phone]   Do not eat food  or drink liquids :After Midnight.            If you have questions, please contact your surgeon's office.   FOLLOW BOWEL PREP AND ANY ADDITIONAL PRE OP INSTRUCTIONS YOU RECEIVED FROM YOUR SURGEON'S OFFICE!!!     Oral Hygiene is also important to reduce your risk of infection.                                    Remember - BRUSH YOUR TEETH THE MORNING OF SURGERY WITH YOUR REGULAR TOOTHPASTE  DENTURES WILL BE REMOVED PRIOR TO SURGERY PLEASE DO NOT APPLY "Poly grip" OR ADHESIVES!!!   Do NOT smoke after Midnight   Take these medicines the morning of surgery with A SIP OF WATER: NONE                HOLD   ZEPBOUND   7 DAYS PRIOR TO SURGERY  Bring CPAP mask and tubing day of surgery.                              You may not have any metal on your body including hair pins, jewelry, and body piercing             Do not wear , lotions, powders, perfumes/cologne,  or deodorant        Men may shave face and neck   Do not bring valuables to the hospital. [place].   Contacts, glasses, dentures or bridgework may not be worn into surgery.   Bring small overnight bag day of surgery.   DO NOT [place]. PHARMACY WILL DISPENSE MEDICATIONS LISTED ON YOUR MEDICATION LIST TO YOU DURING YOUR ADMISSION [place]!    Patients discharged on the day of surgery will not be allowed to drive home.  Someone NEEDS to stay with you for the first 24 hours after anesthesia.   Special Instructions: Bring a copy of your healthcare power of attorney and living will documents the day of surgery if you haven't scanned them before.  Please read over the following fact sheets you were given: IF [place] [phone]   If you received a COVID test during your pre-op visit  it is requested that you wear a mask when out in public, stay away from anyone that may not be feeling well and notify your surgeon if you develop symptoms. If you test positive for Covid or have been in contact with anyone that has tested positive in the last 10 days please notify you surgeon.    [place] - Preparing for Surgery Before surgery, you can play an important role.  Because skin is not sterile, your skin needs to be as free of germs as possible.  You can reduce the number of germs on your skin by washing with CHG (chlorahexidine gluconate) soap before surgery.  CHG is an antiseptic cleaner which kills germs and bonds with the skin to continue killing germs even after washing. Please DO NOT use if you have an allergy to CHG or antibacterial soaps.  If your skin becomes reddened/irritated stop using the CHG and inform your nurse when you arrive at Short Stay. Do not shave (including legs and underarms) for at least 48 hours prior to the first  CHG shower.  You may shave your face/neck. Please follow these instructions carefully:  1.  Shower with CHG Soap the night before surgery and the  morning of Surgery.  2.  If you choose to wash your hair, wash your hair first as usual with your  normal  shampoo.  3.  After you shampoo, rinse your hair and body thoroughly to remove the  shampoo.                           4.  Use CHG as you would any other liquid soap.  You can apply chg directly  to the skin and wash                       Gently with a scrungie or clean washcloth.  5.  Apply the CHG Soap to your body ONLY FROM THE NECK DOWN.   Do not use on face/ open                           Wound or open sores. Avoid contact with eyes, ears mouth and genitals (private parts).                       Wash face,  Genitals (private parts) with your normal soap.             6.  Wash thoroughly, paying special attention to the area where your surgery  will be performed.  7.  Thoroughly rinse your body with warm water from the neck down.  8.  DO NOT shower/wash with your normal soap after using and rinsing off  the CHG Soap.                9.  Pat yourself dry with a clean towel.            10.  Wear clean pajamas.            11.  Place clean sheets on your bed the night of your first shower and do not  sleep with pets. Day of Surgery : Do not apply any  lotions/deodorants the morning of surgery.  Please wear clean clothes to the hospital/surgery center.  FAILURE TO FOLLOW THESE INSTRUCTIONS MAY RESULT IN THE CANCELLATION OF YOUR SURGERY PATIENT SIGNATURE_________________________________  NURSE SIGNATURE__________________________________  ________________________________________________________________________

## 2022-03-21 ENCOUNTER — Encounter (HOSPITAL_COMMUNITY): Payer: Self-pay

## 2022-03-21 ENCOUNTER — Encounter (HOSPITAL_COMMUNITY)
Admission: RE | Admit: 2022-03-21 | Discharge: 2022-03-21 | Disposition: A | Discharge status: Home / Self Care | Payer: Medicare Other | Source: Ambulatory Visit | Attending: Urology | Admitting: Urology

## 2022-03-21 ENCOUNTER — Other Ambulatory Visit: Payer: Self-pay

## 2022-03-21 VITALS — BP 138/94 | HR 74 | Temp 98.5°F | Resp 16 | Ht 71.5 in | Wt 254.0 lb

## 2022-03-21 DIAGNOSIS — Z01812 Encounter for preprocedural laboratory examination: Secondary | ICD-10-CM | POA: Insufficient documentation

## 2022-03-21 DIAGNOSIS — I10 Essential (primary) hypertension: Secondary | ICD-10-CM | POA: Diagnosis not present

## 2022-03-21 HISTORY — DX: Malignant (primary) neoplasm, unspecified: C80.1

## 2022-03-21 HISTORY — DX: Sleep apnea, unspecified: G47.30

## 2022-03-21 HISTORY — DX: Pneumonia, unspecified organism: J18.9

## 2022-03-21 HISTORY — DX: Personal history of urinary calculi: Z87.442

## 2022-03-21 LAB — BASIC METABOLIC PANEL
Anion gap: 10 (ref 5–15)
BUN: 22 mg/dL (ref 8–23)
CO2: 24 mmol/L (ref 22–32)
Calcium: 9.2 mg/dL (ref 8.9–10.3)
Chloride: 103 mmol/L (ref 98–111)
Creatinine, Ser: 0.94 mg/dL (ref 0.61–1.24)
GFR, Estimated: >60 mL/min (ref >60)
Glucose, Bld: 79 mg/dL (ref 70–99)
Potassium: 4.6 mmol/L (ref 3.5–5.1)
Sodium: 137 mmol/L (ref 135–145)

## 2022-03-21 LAB — CBC
HCT: 51.5 % (ref 39.0–52.0)
Hemoglobin: 17.2 g/dL — ABNORMAL HIGH (ref 13.0–17.0)
MCH: 31.6 pg (ref 26.0–34.0)
MCHC: 33.4 g/dL (ref 30.0–36.0)
MCV: 94.7 fL (ref 80.0–100.0)
Platelets: 220 10*3/uL (ref 150–400)
RBC: 5.44 MIL/uL (ref 4.22–5.81)
RDW: 12.4 % (ref 11.5–15.5)
WBC: 5.5 10*3/uL (ref 4.0–10.5)
nRBC: 0 % (ref 0.0–0.2)

## 2022-03-27 NOTE — H&P (Signed)
My PSA is elevated above the normal range.     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 He transitioned to a Sports coach for stone management after his last visit here in 5/23. He has a chronically elevated PSA that has been about 6.5 for years and a prior negative biopsy. When I saw him in 1/22 there was mild induration of the right apex and f/u was recommended.   03/08/20: Mark Li is a former patient of the practice who was last seen in 2011. He had a negative prostate biopsy for a PSA of 6.83 by Dr. Serita Butcher. His prostate volume was 62m. He is sent back now by his PCP for a PSA of 7.1. He is voiding well with an IPSS of 4 with some nocturia and frequency. He has had no hematuria or dysuira. His father's brother had prostate cancer.    ALLERGIES: Amoxicillin TABS Penicillin    MEDICATIONS: Lisinopril  Celebrex  Zepbound     GU PSH: No GU PSH      PSH Notes: Preventive medication therapy needed, Appendectomy, Ankle Repair   NON-GU PSH: Appendectomy - 2010     GU PMH: Ureteral calculus - 07/05/2021 Ureteral obstruction secondary to calculous - 07/05/2021 Elevated PSA, His PSA is minimally changed over the last 10 years. He has a very small indurated area at the right apex that is not documented on prior exams. I am going to have him return in 6 months with a repeat PSA for another exam. - 03/08/2020, Elevated prostate specific antigen (PSA), - 2014 Nocturia, He has nocturia x 2. 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 16:45:58 - Note: Neoplasm Of The Prostate Gland  2008-12-28 14:43:34 - Note: Arthritis   NON-GU PMH: Personal history of other endocrine, nutritional and metabolic disease, History of  hypercholesterolemia - 2014 Personal history of other specified conditions, History of heartburn - 2014 Arthritis GERD Hypercholesterolemia Hypertension Sleep Apnea    FAMILY HISTORY: 1 Daughter - Other 1 son - Other Acute Myocardial Infarction - Father, Uncle Breast Cancer - Mother copd - Mother Family Health Status - Father alive at age 67- R9In FWaKeeneyStatus - Mother's Age - Runs In Family Family Health Status Number - Runs In Family nephrolithiasis - Uncle, Father Prostate Cancer - Uncle   SOCIAL HISTORY: Marital Status: Married Preferred Language: English; Race: White Current Smoking Status: Patient does not smoke anymore. Has not smoked since 02/28/1988.   Tobacco Use Assessment Completed: Used Tobacco in last 30 days? Drinks 4+ caffeinated drinks per day.     Notes: Tobacco Use, Caffeine Use, Marital History - Currently Married, Alcohol Use, Occupation:   REVIEW OF SYSTEMS:    GU Review Male:   Patient denies hard to postpone urination, trouble starting your stream, frequent urination, get up at night to urinate, stream starts and stops, erection problems, have to strain to urinate , penile pain, leakage of urine, and burning/ pain with urination.  Gastrointestinal (Upper):   Patient denies nausea, vomiting, and indigestion/ heartburn.  Gastrointestinal (Lower):   Patient denies diarrhea and constipation.  Constitutional:   With Zepbound.  Patient reports weight loss. Patient denies fever, night sweats, and fatigue.  Skin:   Patient denies  skin rash/ lesion and itching.  Eyes:   Patient denies blurred vision and double vision.  Ears/ Nose/ Throat:   Patient denies sore throat and sinus problems.  Hematologic/Lymphatic:   Patient denies swollen glands and easy bruising.  Cardiovascular:   Patient denies leg swelling and chest pains.  Respiratory:   Patient denies cough and shortness of breath.  Endocrine:   Patient denies excessive thirst.   Musculoskeletal:   Patient reports joint pain. Patient denies back pain.  Neurological:   Patient denies headaches and dizziness.  Psychologic:   Patient denies depression and anxiety.   VITAL SIGNS: None   GU PHYSICAL EXAMINATION:    Anus and Perineum: No hemorrhoids. No anal stenosis. No rectal fissure, no anal fissure. No edema, no dimple, no perineal tenderness, no anal tenderness.  Prostate: Prostate 2 + size. Right apex 8 mm prostate nodule. Left lobe normal consistency, right lobe normal consistency. Symmetrical lobes. Left lobe no tenderness, right lobe no tenderness.   Seminal Vesicles: Nonpalpable.  Sphincter Tone: Normal sphincter. No rectal tenderness. No rectal mass.    MULTI-SYSTEM PHYSICAL EXAMINATION:    Constitutional: Well-nourished. No physical deformities. Normally developed. Good grooming.  Respiratory: Normal breath sounds. No labored breathing, no use of accessory muscles.   Cardiovascular: Regular rate and rhythm. No murmur, no gallop.      Complexity of Data:  Lab Test Review:   PSA  Records Review:   Previous Doctor Records, Previous Patient Records  Urine Test Review:   Urinalysis  X-Ray Review: MRI Prostate GSORAD: Reviewed Report. Discussed With Patient.     11/09/20 01/13/20 07/29/09 12/28/08  PSA  Total PSA 5.24 ng/mL 7.1 ng/ml 6.83  6.35   Free PSA 0.83 ng/mL   0.70   % Free PSA 16 % PSA   11.0     PROCEDURES:          Urinalysis - 81003 Dipstick Dipstick Cont'd  Color: Amber Bilirubin: Neg  Appearance: Clear Ketones: Neg  Specific Gravity: 1.025 Blood: Neg  pH: 6.0 Protein: Neg  Glucose: Neg Urobilinogen: 0.2    Nitrites: Neg    Leukocyte Esterase: Neg    Notes:      ASSESSMENT:      ICD-10 Details  1 GU:   Elevated PSA - R97.20 Chronic, Stable - He has a stable PSA over 10+ years but has a palpable nodule that is larger than in 1/22 and MR evidence of a PIRADS 4 lesion in the area of the nodule. I discussed options for management  including a return to [facility] for an MR fusion biopsy because we can't use his current study in our system or a cognative fusion biopsy which I think would be very doable based on his exam and MRI findings. He would like sedation so I will get him set up in the OR for the biopsy. I reviewed the procedure and the risks of bleeding, infection and difficulty voiding.   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

## 2022-03-28 ENCOUNTER — Ambulatory Visit (HOSPITAL_BASED_OUTPATIENT_CLINIC_OR_DEPARTMENT_OTHER): Payer: Medicare Other | Admitting: Anesthesiology

## 2022-03-28 ENCOUNTER — Ambulatory Visit (HOSPITAL_COMMUNITY)
Admission: RE | Admit: 2022-03-28 | Discharge: 2022-03-28 | Disposition: A | Discharge status: Home / Self Care | Payer: Medicare Other | Source: Ambulatory Visit | Attending: Urology | Admitting: Urology

## 2022-03-28 ENCOUNTER — Ambulatory Visit (HOSPITAL_COMMUNITY)
Admission: RE | Admit: 2022-03-28 | Discharge: 2022-03-28 | Disposition: A | Discharge status: Home / Self Care | Payer: Medicare Other | Attending: Urology | Admitting: Urology

## 2022-03-28 ENCOUNTER — Encounter (HOSPITAL_COMMUNITY): Payer: Self-pay | Admitting: Urology

## 2022-03-28 ENCOUNTER — Ambulatory Visit (HOSPITAL_COMMUNITY): Payer: Medicare Other | Admitting: Anesthesiology

## 2022-03-28 ENCOUNTER — Other Ambulatory Visit: Payer: Self-pay

## 2022-03-28 ENCOUNTER — Encounter (HOSPITAL_COMMUNITY)
Admission: RE | Disposition: A | Discharge status: Home / Self Care | Payer: Self-pay | Source: Home / Self Care | Attending: Urology

## 2022-03-28 DIAGNOSIS — G4730 Sleep apnea, unspecified: Secondary | ICD-10-CM | POA: Insufficient documentation

## 2022-03-28 DIAGNOSIS — C61 Malignant neoplasm of prostate: Secondary | ICD-10-CM | POA: Insufficient documentation

## 2022-03-28 DIAGNOSIS — Z6834 Body mass index (BMI) 34.0-34.9, adult: Secondary | ICD-10-CM | POA: Insufficient documentation

## 2022-03-28 DIAGNOSIS — Z803 Family history of malignant neoplasm of breast: Secondary | ICD-10-CM | POA: Insufficient documentation

## 2022-03-28 DIAGNOSIS — E669 Obesity, unspecified: Secondary | ICD-10-CM | POA: Insufficient documentation

## 2022-03-28 DIAGNOSIS — Z8042 Family history of malignant neoplasm of prostate: Secondary | ICD-10-CM | POA: Insufficient documentation

## 2022-03-28 DIAGNOSIS — I10 Essential (primary) hypertension: Secondary | ICD-10-CM

## 2022-03-28 DIAGNOSIS — R351 Nocturia: Secondary | ICD-10-CM | POA: Diagnosis not present

## 2022-03-28 DIAGNOSIS — R972 Elevated prostate specific antigen [PSA]: Secondary | ICD-10-CM

## 2022-03-28 DIAGNOSIS — N402 Nodular prostate without lower urinary tract symptoms: Secondary | ICD-10-CM | POA: Diagnosis not present

## 2022-03-28 DIAGNOSIS — Z87891 Personal history of nicotine dependence: Secondary | ICD-10-CM | POA: Insufficient documentation

## 2022-03-28 DIAGNOSIS — R350 Frequency of micturition: Secondary | ICD-10-CM | POA: Diagnosis not present

## 2022-03-28 HISTORY — PX: PROSTATE BIOPSY: SHX241

## 2022-03-28 SURGERY — BIOPSY, PROSTATE, RECTAL APPROACH, WITH US GUIDANCE
Anesthesia: Monitor Anesthesia Care

## 2022-03-28 MED ORDER — FENTANYL CITRATE PF 50 MCG/ML IJ SOSY: 25.0000 ug | PREFILLED_SYRINGE | INTRAMUSCULAR | Status: DC | PRN

## 2022-03-28 MED ORDER — GENTAMICIN SULFATE 40 MG/ML IJ SOLN
5.0000 mg/kg | INTRAVENOUS | Status: AC
  Administered 2022-03-28: 460 mg via INTRAVENOUS
  Filled 2022-03-28 (×2): qty 11.5

## 2022-03-28 MED ORDER — PROPOFOL 10 MG/ML IV BOLUS
INTRAVENOUS | Status: DC | PRN
  Administered 2022-03-28: 20 mg via INTRAVENOUS

## 2022-03-28 MED ORDER — CIPROFLOXACIN IN D5W 400 MG/200ML IV SOLN
400.0000 mg | Freq: Two times a day (BID) | INTRAVENOUS | Status: DC
  Administered 2022-03-28: 400 mg via INTRAVENOUS
  Filled 2022-03-28: qty 200

## 2022-03-28 MED ORDER — LIDOCAINE HCL (PF) 2 % IJ SOLN
INTRAMUSCULAR | Status: AC
  Filled 2022-03-28: qty 5

## 2022-03-28 MED ORDER — PROPOFOL 500 MG/50ML IV EMUL
INTRAVENOUS | Status: DC | PRN
  Administered 2022-03-28: 100 ug/kg/min via INTRAVENOUS

## 2022-03-28 MED ORDER — ORAL CARE MOUTH RINSE: 15.0000 mL | Freq: Once | OROMUCOSAL | Status: AC

## 2022-03-28 MED ORDER — AMISULPRIDE (ANTIEMETIC) 5 MG/2ML IV SOLN: 10.0000 mg | Freq: Once | INTRAVENOUS | Status: DC | PRN

## 2022-03-28 MED ORDER — PHENYLEPHRINE 80 MCG/ML (10ML) SYRINGE FOR IV PUSH (FOR BLOOD PRESSURE SUPPORT)
PREFILLED_SYRINGE | INTRAVENOUS | Status: DC | PRN
  Administered 2022-03-28: 160 ug via INTRAVENOUS
  Administered 2022-03-28: 240 ug via INTRAVENOUS

## 2022-03-28 MED ORDER — ONDANSETRON HCL 4 MG/2ML IJ SOLN: 4.0000 mg | Freq: Once | INTRAMUSCULAR | Status: DC | PRN

## 2022-03-28 MED ORDER — MIDAZOLAM HCL 5 MG/5ML IJ SOLN
INTRAMUSCULAR | Status: DC | PRN
  Administered 2022-03-28: 2 mg via INTRAVENOUS

## 2022-03-28 MED ORDER — SODIUM CHLORIDE 0.9% FLUSH: 3.0000 mL | Freq: Two times a day (BID) | INTRAVENOUS | Status: DC

## 2022-03-28 MED ORDER — DEXAMETHASONE SODIUM PHOSPHATE 10 MG/ML IJ SOLN
INTRAMUSCULAR | Status: AC
  Filled 2022-03-28: qty 1

## 2022-03-28 MED ORDER — ONDANSETRON HCL 4 MG/2ML IJ SOLN
INTRAMUSCULAR | Status: AC
  Filled 2022-03-28: qty 2

## 2022-03-28 MED ORDER — KETOROLAC TROMETHAMINE 15 MG/ML IJ SOLN: 15.0000 mg | Freq: Once | INTRAMUSCULAR | Status: DC | PRN

## 2022-03-28 MED ORDER — PROPOFOL 1000 MG/100ML IV EMUL
INTRAVENOUS | Status: AC
  Filled 2022-03-28: qty 100

## 2022-03-28 MED ORDER — LIDOCAINE HCL 2 % IJ SOLN
INTRAMUSCULAR | Status: AC
  Filled 2022-03-28: qty 20

## 2022-03-28 MED ORDER — FENTANYL CITRATE (PF) 100 MCG/2ML IJ SOLN
INTRAMUSCULAR | Status: DC | PRN
  Administered 2022-03-28: 100 ug via INTRAVENOUS

## 2022-03-28 MED ORDER — MIDAZOLAM HCL 2 MG/2ML IJ SOLN
INTRAMUSCULAR | Status: AC
  Filled 2022-03-28: qty 2

## 2022-03-28 MED ORDER — CHLORHEXIDINE GLUCONATE 0.12 % MT SOLN
15.0000 mL | Freq: Once | OROMUCOSAL | Status: AC
  Administered 2022-03-28: 15 mL via OROMUCOSAL

## 2022-03-28 MED ORDER — LACTATED RINGERS IV SOLN: INTRAVENOUS | Status: DC

## 2022-03-28 MED ORDER — PHENYLEPHRINE 80 MCG/ML (10ML) SYRINGE FOR IV PUSH (FOR BLOOD PRESSURE SUPPORT)
PREFILLED_SYRINGE | INTRAVENOUS | Status: AC
  Filled 2022-03-28: qty 10

## 2022-03-28 MED ORDER — FENTANYL CITRATE (PF) 100 MCG/2ML IJ SOLN
INTRAMUSCULAR | Status: AC
  Filled 2022-03-28: qty 2

## 2022-03-28 MED ORDER — ACETAMINOPHEN 10 MG/ML IV SOLN: 1000.0000 mg | Freq: Once | INTRAVENOUS | Status: DC | PRN

## 2022-03-28 MED ORDER — PROPOFOL 10 MG/ML IV BOLUS
INTRAVENOUS | Status: AC
  Filled 2022-03-28: qty 20

## 2022-03-28 SURGICAL SUPPLY — 13 items
BAG COUNTER SPONGE SURGICOUNT (BAG) IMPLANT
DRSG TELFA 3X8 NADH STRL (GAUZE/BANDAGES/DRESSINGS) ×1 IMPLANT
GLOVE SURG SS PI 8.0 STRL IVOR (GLOVE) IMPLANT
INST BIOPSY MAXCORE 18GX25 (NEEDLE) IMPLANT
INSTR BIOPSY MAXCORE 18GX20 (NEEDLE) IMPLANT
NDL SAFETY ECLIP 18X1.5 (MISCELLANEOUS) IMPLANT
NDL SPNL 22GX7 QUINCKE BK (NEEDLE) IMPLANT
NEEDLE SPNL 22GX7 QUINCKE BK (NEEDLE) ×1 IMPLANT
PENCIL SMOKE EVACUATOR (MISCELLANEOUS) IMPLANT
SURGILUBE 2OZ TUBE FLIPTOP (MISCELLANEOUS) ×1 IMPLANT
SYR CONTROL 10ML LL (SYRINGE) IMPLANT
TOWEL OR 17X26 10 PK STRL BLUE (TOWEL DISPOSABLE) ×1 IMPLANT
UNDERPAD 30X36 HEAVY ABSORB (UNDERPADS AND DIAPERS) ×1 IMPLANT

## 2022-03-28 NOTE — Interval H&P Note (Signed)
History and Physical Interval Note:  03/28/2022 10:59 AM  Mark Li  has presented today for surgery, with the diagnosis of [place].  The various methods of treatment have been discussed with the patient and family. After consideration of risks, benefits and other options for treatment, the patient has consented to  Procedure(s) with comments: BIOPSY TRANSRECTAL ULTRASONIC PROSTATE (TUBP) (N/A) - 45 MINS FOR CASE as a surgical intervention.  The patient's history has been reviewed, patient examined, no change in status, stable for surgery.  I have reviewed the patient's chart and labs.  Questions were answered to the patient's satisfaction.     Irine Seal

## 2022-03-28 NOTE — Anesthesia Procedure Notes (Signed)
Date/Time: 03/28/2022 11:15 AM  Performed by: Sharlette Dense, CRNAOxygen Delivery Method: Simple face mask

## 2022-03-28 NOTE — Transfer of Care (Signed)
Immediate Anesthesia Transfer of Care Note  Patient: Mark Li  Procedure(s) Performed: BIOPSY TRANSRECTAL ULTRASONIC PROSTATE (TUBP)  Patient Location: PACU  Anesthesia Type:MAC  Level of Consciousness: awake, alert , oriented, and patient cooperative  Airway & Oxygen Therapy: Patient Spontanous Breathing and Patient connected to face mask  Post-op Assessment: Report given to RN and Post -op Vital signs reviewed and stable  Post vital signs: Reviewed and stable  Last Vitals:  Vitals Value Taken Time  BP 102/57 03/28/22 1145  Temp    Pulse 71 03/28/22 1145  Resp 11 03/28/22 1145  SpO2 100 % 03/28/22 1145  Vitals shown include unvalidated device data.  Last Pain:  Vitals:   03/28/22 0955  TempSrc:   PainSc: 0-No pain         Complications: No notable events documented.

## 2022-03-28 NOTE — Anesthesia Preprocedure Evaluation (Addendum)
Anesthesia Evaluation  Patient identified by MRN, date of birth, ID band Patient awake    Reviewed: Allergy & Precautions, NPO status , Patient's Chart, lab work & pertinent test results  Airway Mallampati: II  TM Distance: >3 FB Neck ROM: Full    Dental no notable dental hx.    Pulmonary sleep apnea and Continuous Positive Airway Pressure Ventilation , former smoker   Pulmonary exam normal        Cardiovascular hypertension, Pt. on medications Normal cardiovascular exam     Neuro/Psych negative neurological ROS  negative psych ROS   GI/Hepatic negative GI ROS, Neg liver ROS,,,  Endo/Other  Patient on GLP-1 Agonist!  Renal/GU negative Renal ROS     Musculoskeletal negative musculoskeletal ROS (+)    Abdominal  (+) + obese  Peds  Hematology negative hematology ROS (+)   Anesthesia Other Findings PROSTATE NODULE ELEVATED PROSTATE SPECIFIC ANTIGEN  Reproductive/Obstetrics                             Anesthesia Physical Anesthesia Plan  ASA: 2  Anesthesia Plan: MAC   Post-op Pain Management:    Induction: Intravenous  PONV Risk Score and Plan: 1 and Ondansetron, Dexamethasone, Propofol infusion and Treatment may vary due to age or medical condition  Airway Management Planned: Simple Face Mask  Additional Equipment:   Intra-op Plan:   Post-operative Plan:   Informed Consent: I have reviewed the patients History and Physical, chart, labs and discussed the procedure including the risks, benefits and alternatives for the proposed anesthesia with the patient or authorized representative who has indicated his/her understanding and acceptance.     Dental advisory given  Plan Discussed with: CRNA  Anesthesia Plan Comments:        Anesthesia Quick Evaluation

## 2022-03-28 NOTE — Discharge Instructions (Signed)
Please call if you have a fever, heavy bleeding or difficulty voiding.  No lifting, straining or heavy lifting for 24hr.

## 2022-03-28 NOTE — Op Note (Signed)
Prostate Biopsy Procedure   Procedures: TRUS with US guided prostate biopsy.  Preop Dx: Elevated PSA with prostate nodule and PIRADS 4 lesion at the right mid prostate.  Postop Dx: Same.  Surgeon: Dr. Irine Seal  Anesthesia: MAC.  Specimen: see below.  Drain: None.  EBL: None.  Complications: None.   Pre-Procedure: - Last PSA Level: 6.83 - Gentamicin and Cipro given prophylactically -Transrectal Ultrasound performed revealing a 65 gm prostate -He has a small middle lobe and a hypoechoic nodule in the right posterior mid to apical prostate at the location of the MRI PIRADS 4 lesion and the palpable nodule.   Procedure: - Prostate block performed using 10 cc 1% lidocaine and 2 needle biopsies were taken from the small hypoechoic lesion and 12 needle biopsies were taken from sextant areas, a total of 12 under ultrasound guidance.  Post-Procedure: - Patient tolerated the procedure well - He was counseled to seek immediate medical attention if experiences any severe pain, significant bleeding, or fevers - Return in one week to discuss biopsy results

## 2022-03-29 ENCOUNTER — Encounter (HOSPITAL_COMMUNITY): Payer: Self-pay | Admitting: Urology

## 2022-03-29 NOTE — Anesthesia Postprocedure Evaluation (Signed)
Anesthesia Post Note  Patient: Mark Li  Procedure(s) Performed: BIOPSY TRANSRECTAL ULTRASONIC PROSTATE (TUBP)     Patient location during evaluation: PACU Anesthesia Type: MAC Level of consciousness: awake Pain management: pain level controlled Vital Signs Assessment: post-procedure vital signs reviewed and stable Respiratory status: spontaneous breathing, nonlabored ventilation and respiratory function stable Cardiovascular status: blood pressure returned to baseline and stable Postop Assessment: no apparent nausea or vomiting Anesthetic complications: no   No notable events documented.  Last Vitals:  Vitals:   03/28/22 1215 03/28/22 1245  BP: 134/83 130/88  Pulse: 70 74  Resp: 12   Temp:    SpO2: 100% 99%    Last Pain:  Vitals:   03/28/22 1145  TempSrc:   PainSc: 0-No pain                 Soila Printup P Jacquese Hackman

## 2022-03-30 LAB — SURGICAL PATHOLOGY

## 2022-05-09 ENCOUNTER — Other Ambulatory Visit: Payer: Self-pay | Admitting: Radiation Oncology

## 2022-05-09 ENCOUNTER — Ambulatory Visit
Admission: RE | Admit: 2022-05-09 | Discharge: 2022-05-09 | Disposition: A | Discharge status: Home / Self Care | Payer: Self-pay | Source: Ambulatory Visit | Attending: Radiation Oncology | Admitting: Radiation Oncology

## 2022-05-09 DIAGNOSIS — N403 Nodular prostate with lower urinary tract symptoms: Secondary | ICD-10-CM

## 2022-05-09 DIAGNOSIS — R351 Nocturia: Secondary | ICD-10-CM

## 2022-05-09 DIAGNOSIS — C61 Malignant neoplasm of prostate: Secondary | ICD-10-CM

## 2022-05-23 NOTE — Progress Notes (Signed)
GU Location of Tumor / Histology: Prostate Ca  If Prostate Cancer, Gleason Score is (3 + 4) and PSA is (6.5 on 12/18/2021)  Biopsies     Past/Anticipated interventions by urology, if any:      Past/Anticipated interventions by medical oncology, if any: NA  Weight changes, if any:  No  IPSS:  7 SHIM: 23  Bowel/Bladder complaints, if any:    Nausea/Vomiting, if any:  No  Pain issues, if any:  2/10 generalized pain all over.  SAFETY ISSUES: Prior radiation?  No Pacemaker/ICD? No Possible current pregnancy? Male Is the patient on methotrexate? No  Current Complaints / other details:

## 2022-05-26 ENCOUNTER — Encounter: Payer: Self-pay | Admitting: Radiation Oncology

## 2022-05-26 ENCOUNTER — Ambulatory Visit
Admission: RE | Admit: 2022-05-26 | Discharge: 2022-05-26 | Disposition: A | Discharge status: Home / Self Care | Payer: Medicare Other | Source: Ambulatory Visit | Attending: Radiation Oncology | Admitting: Radiation Oncology

## 2022-05-26 ENCOUNTER — Other Ambulatory Visit: Payer: Self-pay

## 2022-05-26 VITALS — BP 132/91 | HR 68 | Temp 98.1°F | Resp 20 | Ht 71.0 in | Wt 235.4 lb

## 2022-05-26 DIAGNOSIS — I10 Essential (primary) hypertension: Secondary | ICD-10-CM | POA: Insufficient documentation

## 2022-05-26 DIAGNOSIS — Z85828 Personal history of other malignant neoplasm of skin: Secondary | ICD-10-CM | POA: Diagnosis not present

## 2022-05-26 DIAGNOSIS — Z87442 Personal history of urinary calculi: Secondary | ICD-10-CM | POA: Insufficient documentation

## 2022-05-26 DIAGNOSIS — G4730 Sleep apnea, unspecified: Secondary | ICD-10-CM | POA: Insufficient documentation

## 2022-05-26 DIAGNOSIS — C61 Malignant neoplasm of prostate: Secondary | ICD-10-CM | POA: Insufficient documentation

## 2022-05-26 DIAGNOSIS — Z87891 Personal history of nicotine dependence: Secondary | ICD-10-CM | POA: Insufficient documentation

## 2022-05-26 NOTE — Progress Notes (Signed)
Radiation Oncology         [phone] ________________________________  Initial Outpatient Consultation  Name: Mark Li MRN: [id number]  Date: 05/26/2022  DOB: 06-06-1955  IM:[id number], [facility]  Irine Seal, MD   REFERRING PHYSICIAN: Irine Seal, MD  DIAGNOSIS: 67 y.o. gentleman with Stage T2a adenocarcinoma of the prostate with Gleason score of 3+4, and PSA of 6.5.    ICD-10-CM   1. Malignant neoplasm of prostate ([place])  C61       HISTORY OF PRESENT ILLNESS: Mark Li is a 67 y.o. male with a diagnosis of prostate cancer. He has a longstanding history of elevated PSA since at least 2010 and has been followed intermittently by both [facility] and [facility]. He underwent a prostate biopsy in 2011 with Dr. Serita Butcher that was benign. He then established care with a [facility] Urologist for management of kidney stones and did not follow up with [facility] regarding the elevated PSA until 02/2020. His PSA had increased to 7.1 in November 2021.  At the time of his follow-up visit 03/08/2020, DRE did show induration but no discrete nodularity.  The PSA subsequently decreased to 5.24 in September 2022 but was back up to 6.5 in October 2023.  He had a prostate MRI at [facility] in December 2023 that showed a PI-RADS 4 lesion in the right mid apical region so he followed up with Dr. Jeffie Pollock on 02/22/22, and digital rectal examination performed at that time revealed a new 8 mm right apex nodule.  In light of the persistently elevated PSA and new prostate nodule, the patient proceeded to MRI fusion biopsy of the prostate in the OR on 03/28/22, under the care of Dr. Jeffie Pollock.  The prostate volume measured 65 cc.  Out of 14 core biopsies, only 1 was positive.  The maximum Gleason score was 3+4, and this was seen in 1 of 2 samples from the right mid MRI ROI nodule.  The patient reviewed the biopsy results with his urologist and he has kindly been referred  today for discussion of potential radiation treatment options. He is also scheduled to meet Dr. Thomos Lemons in oncology at [facility] on 05/29/22 for consultation to discuss potential focal therapy given his very small volume disease.  PREVIOUS RADIATION THERAPY: No  PAST MEDICAL HISTORY:  Past Medical History:  Diagnosis Date   Cancer ([place])    basal and squamous skin cancer   History of kidney stones    Hypertension    Pneumonia    Sleep apnea    cpap   Sleep disturbance 03/20/2013      PAST SURGICAL HISTORY: Past Surgical History:  Procedure Laterality Date   APPENDECTOMY     FRACTURE SURGERY     right foot crushed   PROSTATE BIOPSY N/A 03/28/2022   Procedure: BIOPSY TRANSRECTAL ULTRASONIC PROSTATE (TUBP);  Surgeon: Irine Seal, MD;  Location: [facility];  Service: Urology;  Laterality: N/A;  33 MINS FOR CASE    FAMILY HISTORY:  Family History  Problem Relation Age of Onset   COPD Mother    Heart disease Father     SOCIAL HISTORY:  Social History   Socioeconomic History   Marital status: Married    Spouse name: Not on file   Number of children: Not on file   Years of education: Not on file   Highest education level: Not on file  Occupational History   Not on file  Tobacco Use   Smoking status:  Former    Years: 6    Types: Cigarettes   Smokeless tobacco: Never   Tobacco comments:    Quit 20 years ago not an everyday smoker even when smoked  Vaping Use   Vaping Use: Never used  Substance and Sexual Activity   Alcohol use: Yes    Comment: 10 drinks (beer, wine, liquor) per week   Drug use: No   Sexual activity: Not Currently  Other Topics Concern   Not on file  Social History Narrative   Not on file   Social Determinants of Health   Financial Resource Strain: Not on file  Food Insecurity: No Food Insecurity (05/26/2022)   Hunger Vital Sign    Worried About Running Out of Food in the Last Year: Never true    Ran Out of Food in the Last Year: Never true   Transportation Needs: No Transportation Needs (05/26/2022)   PRAPARE - Hydrologist (Medical): No    Lack of Transportation (Non-Medical): No  Physical Activity: Not on file  Stress: Not on file  Social Connections: Not on file  Intimate Partner Violence: Not At Risk (05/26/2022)   Humiliation, Afraid, Rape, and Kick questionnaire    Fear of Current or Ex-Partner: No    Emotionally Abused: No    Physically Abused: No    Sexually Abused: No    ALLERGIES: Amoxicillin, Other, Penicillins, Vitamin c, Zinc, and Zithromax [azithromycin dihydrate]  MEDICATIONS:  Current Outpatient Medications  Medication Sig Dispense Refill   fluorouracil (EFUDEX) 5 % cream Apply 1 Application topically 2 (two) times daily.     ZEPBOUND 2.5 MG/0.5ML Pen Inject 2.5 mg into the skin every Wednesday.     No current facility-administered medications for this encounter.   Facility-Administered Medications Ordered in Other Encounters  Medication Dose Route Frequency Provider Last Rate Last Admin   sodium phosphate (FLEET) 7-19 GM/118ML enema 1 enema  1 enema Rectal Once Irine Seal, MD        REVIEW OF SYSTEMS:  On review of systems, the patient reports that he is doing well overall. He denies any chest pain, shortness of breath, cough, fevers, chills, night sweats, unintended weight changes. He denies any bowel disturbances, and denies abdominal pain, nausea or vomiting. He denies any new musculoskeletal or joint aches or pains. His IPSS was 7, indicating mild urinary symptoms. His SHIM was 23, indicating he does not have erectile dysfunction. A complete review of systems is obtained and is otherwise negative.    PHYSICAL EXAM:  Wt Readings from Last 3 Encounters:  05/26/22 235 lb 6.4 oz (106.8 kg)  03/28/22 253 lb 8.5 oz (115 kg)  03/21/22 254 lb (115.2 kg)   Temp Readings from Last 3 Encounters:  05/26/22 98.1 F (36.7 C) (Oral)  03/28/22 97.8 F (36.6 C)  03/21/22 98.5  F (36.9 C) (Oral)   BP Readings from Last 3 Encounters:  05/26/22 (!) 132/91  03/28/22 130/88  03/21/22 (!) 138/94   Pulse Readings from Last 3 Encounters:  05/26/22 68  03/28/22 74  03/21/22 74   Pain Assessment Pain Score: 2  Pain Loc: Generalized/10  In general this is a well appearing Caucasian man in no acute distress. He's alert and oriented x4 and appropriate throughout the examination. Cardiopulmonary assessment is negative for acute distress, and he exhibits normal effort.     KPS = 100  100 - Normal; no complaints; no evidence of disease. 90   - Able to  carry on normal activity; minor signs or symptoms of disease. 80   - Normal activity with effort; some signs or symptoms of disease. 39   - Cares for self; unable to carry on normal activity or to do active work. 60   - Requires occasional assistance, but is able to care for most of his personal needs. 50   - Requires considerable assistance and frequent medical care. 37   - Disabled; requires special care and assistance. 47   - Severely disabled; hospital admission is indicated although death not imminent. 70   - Very sick; hospital admission necessary; active supportive treatment necessary. 10   - Moribund; fatal processes progressing rapidly. 0     - Dead  Karnofsky DA, Abelmann [place], Craver LS and Burchenal [facility] [phone]) The use of the nitrogen mustards in the palliative treatment of carcinoma: with particular reference to bronchogenic carcinoma Cancer 1 634-56  LABORATORY DATA:  Lab Results  Component Value Date   WBC 5.5 03/21/2022   HGB 17.2 (H) 03/21/2022   HCT 51.5 03/21/2022   MCV 94.7 03/21/2022   PLT 220 03/21/2022   Lab Results  Component Value Date   NA 137 03/21/2022   K 4.6 03/21/2022   CL 103 03/21/2022   CO2 24 03/21/2022   Lab Results  Component Value Date   ALT 32 07/05/2021   AST 19 07/05/2021   ALKPHOS 50 07/05/2021   BILITOT 1.0 07/05/2021     RADIOGRAPHY: No results found.     IMPRESSION/PLAN: 1. 67 y.o. gentleman with Stage T2a adenocarcinoma of the prostate with Gleason Score of 3+4, and PSA of 6.5. We discussed the patient's workup and outlined the nature of prostate cancer in this setting. The patient's T stage, Gleason's score, and PSA put him into the favorable intermediate risk group. Accordingly, he is eligible for a variety of potential NCCN guideline recommended treatment options including brachytherapy, 5.5 weeks of external radiation, or prostatectomy. We discussed the available radiation techniques, and focused on the details and logistics of delivery. The patient may not be an ideal candidate for brachytherapy with a prostate volume of 65 cc prior to downsizing with 5-ARI therapy. We discussed that based on his prostate volume, he would require beginning treatment with a 5 alpha reductase inhibitor for 2-3 months to allow for downsizing of the prostate prior to initiating brachytherapy. We discussed and outlined the risks, benefits, short and long-term effects associated with radiotherapy and compared and contrasted these with prostatectomy. We discussed the role of SpaceOAR gel in reducing the rectal toxicity associated with radiotherapy. He appears to have a good understanding of his disease and our treatment recommendations which are of curative intent.  He was encouraged to ask questions that were answered to his stated satisfaction.  At the conclusion of our conversation, the patient is interested in moving forward with the scheduled consult visit with Dr. Thomos Lemons at [facility] on 05/29/2022 to discuss potential focal therapy prior to finalizing his decision.  He has our contact information and knows that he is welcome to call at anytime with any questions or concerns regarding the radiation treatments we discussed today.  We enjoyed meeting him and his wife and look forward to following along his care and would be more than happy to offer a 5.5 week course of daily  external beam radiation or brachytherapy if he ultimately elects to proceed with treatment here in [place].  We personally spent 70 minutes in this encounter including chart review, reviewing radiological  studies, meeting face-to-face with the patient, entering orders and completing documentation.    Nicholos Johns, PA-C    Tyler Pita, MD  [facility] Direct Dial: [phone]  Fax: [phone] [email or web address]  Skype  LinkedIn   This document serves as a record of services personally performed by Tyler Pita, MD and Freeman Caldron, PA-C. It was created on their behalf by Wilburn Mylar, a trained medical scribe. The creation of this record is based on the scribe's personal observations and the provider's statements to them. This document has been checked and approved by the attending provider.

## 2022-05-26 NOTE — Progress Notes (Signed)
Introduced myself to the patient, and his wife, as the prostate nurse navigator.  No barriers to care identified at this time.  He is here to discuss his radiation treatment options. Patient does have a consult with Dr. Thomos Lemons and is agreeable for me to follow up after his visit.  I gave him my business card and asked him to call me with questions or concerns.  Verbalized understanding.

## 2022-06-06 NOTE — Progress Notes (Signed)
Patient was a consult on 3/29 for his stage T2a adenocarcinoma of the prostate with Gleason score of 3+4, and PSA of 6.5.   Patient had recent consult at [facility] with Dr. Harold Barban on 4/1 and patient has decided to participate in a study @ [facility] (Single-Arm Study of [employer] Ablation for Prostate Cancer)

## 2022-07-11 DIAGNOSIS — Z87891 Personal history of nicotine dependence: Secondary | ICD-10-CM | POA: Insufficient documentation

## 2022-08-16 ENCOUNTER — Telehealth: Payer: Self-pay

## 2022-08-16 NOTE — Telephone Encounter (Signed)
Pt called Rn and stated he would like to speak with Dr. Kathrynn Running about treatment options. He reported to RN that he had his biopsy at [facility] and unfortunately the biopsy results increased his gleason score to 4 plus 3. He also stated due to this he is not eligible for the study trial at [facility]. He would like to discuss these results and treatment options with Dr. Kathrynn Running and his team. Rn was able to view biopsy results in care everywhere. He was emotional with this news overall. Rn told pt Dr. Kathrynn Running would be back in the office on Monday. He understood and will expect a call back next week.

## 2022-08-28 NOTE — Progress Notes (Signed)
GU Location of Tumor / Histology: Prostate Ca  If Prostate Cancer, Gleason Score is (3 + 4) and PSA is (6.5 on 11/2021)  Biopsies     Past/Anticipated interventions by urology, if any: NA  Past/Anticipated interventions by medical oncology, if any: NA  Weight changes, if any:  No  IPSS:  3 SHIM: 0  Bowel/Bladder complaints, if any:  No  Nausea/Vomiting, if any:  No  Pain issues, if any:  2/10 right hip  SAFETY ISSUES: Prior radiation?  No Pacemaker/ICD? No Possible current pregnancy? Male Is the patient on methotrexate? No  Current Complaints / other details:

## 2022-08-30 ENCOUNTER — Ambulatory Visit
Admission: RE | Admit: 2022-08-30 | Discharge: 2022-08-30 | Disposition: A | Discharge status: Home / Self Care | Payer: Medicare Other | Source: Ambulatory Visit | Attending: Radiation Oncology | Admitting: Radiation Oncology

## 2022-08-30 ENCOUNTER — Other Ambulatory Visit: Payer: Self-pay

## 2022-08-30 ENCOUNTER — Encounter: Payer: Self-pay | Admitting: Radiation Oncology

## 2022-08-30 VITALS — Ht 71.0 in | Wt 229.0 lb

## 2022-08-30 DIAGNOSIS — C61 Malignant neoplasm of prostate: Secondary | ICD-10-CM

## 2022-08-30 NOTE — Progress Notes (Signed)
Radiation Oncology         [phone] ________________________________  Initial Outpatient Consultation  Name: Mark Li MRN: [id number]  Date: 08/30/2022  DOB: 08-17-1955  NW:[id number], [facility]  Polascik, Latanya Maudlin,*   REFERRING PHYSICIAN: Jeyson, [place],*  DIAGNOSIS: 67 y.o. gentleman with Stage T2a adenocarcinoma of the prostate with Gleason score of 4+3, and PSA of 6.5.    ICD-10-CM   1. Malignant neoplasm of prostate ([facility])  C61        HISTORY OF PRESENT ILLNESS: Mark Li is a 67 y.o. male with a diagnosis of prostate cancer.  We initially met this patient on 05/26/22 for his low volume, Gleason 3+4 prostate carcinoma. We discussed his radiation treatment options of both brachytherapy and external beam radiation. At that time, patient had decided to move forward with the scheduled consult visit with Dr. Harold Barban at [facility] on 05/29/2022 to discuss potential focal therapy prior to finalizing his decision. He proceeded with a repeat biopsy of the prostate on 08/02/2022. Out of 14 core biopsies, only 1 was positive. The maximum Gleason score had increased to 4+3 in the right medial lateral PZ of the mid prostate.   Patient is being seen today to again discuss his treatment options.    Initial Consult HPI from 05/26/22: Patient has a longstanding history of elevated PSA since at least 2010 and has been followed intermittently by both [facility] and [facility]. He underwent a prostate biopsy in 2011 with Dr. Aldean Ast that was benign. He then established care with a [facility] Urologist for management of kidney stones and did not follow up with [facility] regarding the elevated PSA until 02/2020. His PSA had increased to 7.1 in November 2021.  At the time of his follow-up visit 03/08/2020, DRE did show induration but no discrete nodularity.  The PSA subsequently decreased to 5.24 in September 2022 but was back up to 6.5 in  October 2023.  He had a prostate MRI at [facility] in December 2023 that showed a PI-RADS 4 lesion in the right mid apical region so he followed up with Dr. Annabell Howells on 02/22/22, and digital rectal examination performed at that time revealed a new 8 mm right apex nodule.  In light of the persistently elevated PSA and new prostate nodule, the patient proceeded to MRI fusion biopsy of the prostate in the OR on 03/28/22, under the care of Dr. Annabell Howells.  The prostate volume measured 65 cc.  Out of 14 core biopsies, only 1 was positive.  The maximum Gleason score was 3+4, and this was seen in 1 of 2 samples from the right mid MRI ROI nodule.     PREVIOUS RADIATION THERAPY: No  PAST MEDICAL HISTORY:  Past Medical History:  Diagnosis Date   Cancer ([facility])    basal and squamous skin cancer   History of kidney stones    Hypertension    Pneumonia    Sleep apnea    cpap   Sleep disturbance 03/20/2013      PAST SURGICAL HISTORY: Past Surgical History:  Procedure Laterality Date   APPENDECTOMY     FRACTURE SURGERY     right foot crushed   PROSTATE BIOPSY N/A 03/28/2022   Procedure: BIOPSY TRANSRECTAL ULTRASONIC PROSTATE (TUBP);  Surgeon: Bjorn Pippin, MD;  Location: [facility];  Service: Urology;  Laterality: N/A;  45 MINS FOR CASE    FAMILY HISTORY:  Family History  Problem Relation Age of Onset   COPD  Mother    Heart disease Father     SOCIAL HISTORY:  Social History   Socioeconomic History   Marital status: Married    Spouse name: Not on file   Number of children: Not on file   Years of education: Not on file   Highest education level: Not on file  Occupational History   Not on file  Tobacco Use   Smoking status: Former    Years: 6    Types: Cigarettes   Smokeless tobacco: Never   Tobacco comments:    Quit 20 years ago not an everyday smoker even when smoked  Vaping Use   Vaping Use: Never used  Substance and Sexual Activity   Alcohol use: Yes    Comment: 10 drinks (beer, wine, liquor)  per week   Drug use: No   Sexual activity: Not Currently  Other Topics Concern   Not on file  Social History Narrative   Not on file   Social Determinants of Health   Financial Resource Strain: Not on file  Food Insecurity: No Food Insecurity (08/30/2022)   Hunger Vital Sign    Worried About Running Out of Food in the Last Year: Never true    Ran Out of Food in the Last Year: Never true  Transportation Needs: No Transportation Needs (08/30/2022)   PRAPARE - Administrator, Civil Service (Medical): No    Lack of Transportation (Non-Medical): No  Physical Activity: Not on file  Stress: Not on file  Social Connections: Not on file  Intimate Partner Violence: Not At Risk (08/30/2022)   Humiliation, Afraid, Rape, and Kick questionnaire    Fear of Current or Ex-Partner: No    Emotionally Abused: No    Physically Abused: No    Sexually Abused: No    ALLERGIES: Azithromycin dihydrate, Amoxicillin, Other, Penicillins, Vitamin c, and Zinc  MEDICATIONS:  Current Outpatient Medications  Medication Sig Dispense Refill   pravastatin (PRAVACHOL) 10 MG tablet Take by mouth.     No current facility-administered medications for this encounter.   Facility-Administered Medications Ordered in Other Encounters  Medication Dose Route Frequency Provider Last Rate Last Admin   sodium phosphate (FLEET) 7-19 GM/118ML enema 1 enema  1 enema Rectal Once Bjorn Pippin, MD        REVIEW OF SYSTEMS:  On review of systems, the patient reports that he is doing well overall. He denies any chest pain, shortness of breath, cough, fevers, chills, night sweats, unintended weight changes. He denies any bowel disturbances, and denies abdominal pain, nausea or vomiting. He denies any new musculoskeletal or joint aches or pains. His IPSS was 7, indicating mild urinary symptoms. His SHIM was 23, indicating he does not have erectile dysfunction. A complete review of systems is obtained and is otherwise  negative.    PHYSICAL EXAM:  Wt Readings from Last 3 Encounters:  08/30/22 229 lb (103.9 kg)  05/26/22 235 lb 6.4 oz (106.8 kg)  03/28/22 253 lb 8.5 oz (115 kg)   Temp Readings from Last 3 Encounters:  05/26/22 98.1 F (36.7 C) (Oral)  03/28/22 97.8 F (36.6 C)  03/21/22 98.5 F (36.9 C) (Oral)   BP Readings from Last 3 Encounters:  05/26/22 (!) 132/91  03/28/22 130/88  03/21/22 (!) 138/94   Pulse Readings from Last 3 Encounters:  05/26/22 68  03/28/22 74  03/21/22 74   Pain Assessment Pain Score: 2  Pain Loc: Hip (right)/10  In general this is a well appearing  Caucasian man in no acute distress. He's alert and oriented x4 and appropriate throughout the examination. Cardiopulmonary assessment is negative for acute distress, and he exhibits normal effort.     KPS = 100  100 - Normal; no complaints; no evidence of disease. 90   - Able to carry on normal activity; minor signs or symptoms of disease. 80   - Normal activity with effort; some signs or symptoms of disease. 52   - Cares for self; unable to carry on normal activity or to do active work. 60   - Requires occasional assistance, but is able to care for most of his personal needs. 50   - Requires considerable assistance and frequent medical care. 40   - Disabled; requires special care and assistance. 30   - Severely disabled; hospital admission is indicated although death not imminent. 20   - Very sick; hospital admission necessary; active supportive treatment necessary. 10   - Moribund; fatal processes progressing rapidly. 0     - Dead  Karnofsky DA, Abelmann WH, Craver LS and Burchenal [facility] [phone]) The use of the nitrogen mustards in the palliative treatment of carcinoma: with particular reference to bronchogenic carcinoma Cancer 1 634-56  LABORATORY DATA:  Lab Results  Component Value Date   WBC 5.5 03/21/2022   HGB 17.2 (H) 03/21/2022   HCT 51.5 03/21/2022   MCV 94.7 03/21/2022   PLT 220 03/21/2022   Lab  Results  Component Value Date   NA 137 03/21/2022   K 4.6 03/21/2022   CL 103 03/21/2022   CO2 24 03/21/2022   Lab Results  Component Value Date   ALT 32 07/05/2021   AST 19 07/05/2021   ALKPHOS 50 07/05/2021   BILITOT 1.0 07/05/2021     RADIOGRAPHY: No results found.    IMPRESSION/PLAN: 1. 67 y.o. gentleman with Stage T2a adenocarcinoma of the prostate with Gleason Score of 4+3, and PSA of 6.5.  It was a pleasure seeing this patient again today. His most recent biopsy results showed 4+3, so he is no longer a candidate for the research study. He still has very small volume disease and is a good candidate for radiation therapy. Since his prostate is 65 cc, we again discussed that he would require beginning treatment with a 5 alpha reductase inhibitor for 2-3 months to allow for downsizing of the prostate prior to initiating brachytherapy.  After further discussion, this patient would like to proceed with EBRT. We will share our discussion with Dr. Annabell Howells and get him scheduled for fiducial markers and SpaceOar gel placement. Anticipate 5.5 weeks of IMRT to the prostate. We look forward to participating in this patient's care.   We personally spent 40 minutes in this encounter including chart review, reviewing radiological studies, meeting face-to-face with the patient, entering orders and completing documentation.     Joyice Faster, PA-C    Margaretmary Dys, MD  [facility]  Radiation Oncology Direct Dial: [phone]  Fax: [phone] [email or web address]  Skype  LinkedIn

## 2022-09-11 ENCOUNTER — Other Ambulatory Visit: Payer: Self-pay | Admitting: Urology

## 2022-09-14 ENCOUNTER — Other Ambulatory Visit: Payer: Self-pay

## 2022-09-14 ENCOUNTER — Encounter (HOSPITAL_BASED_OUTPATIENT_CLINIC_OR_DEPARTMENT_OTHER): Payer: Self-pay | Admitting: Urology

## 2022-09-14 NOTE — Progress Notes (Signed)
Spoke w/ via phone for pre-op interview--- Pt Lab needs dos---- I-stat, EKG              Lab results------ COVID test -----patient states asymptomatic no test needed Arrive at ------- 0800 NPO after MN NO Solid Food.  Clear liquids from MN until--- 0700 Med rec completed Medications to take morning of surgery ----- Pravachol Diabetic medication ----- N/A Patient instructed no nail polish to be worn day of surgery Patient instructed to bring photo id and insurance card day of surgery: Mark Li Patient aware to have Driver (ride ) / caregiver    for 24 hours after surgery  Patient Special Instructions ----- Bring CPAP, Take enema night before procedure. Pre-Op special Instructions ----- Patient verbalized understanding of instructions that were given at this phone interview. Patient denies shortness of breath, chest pain, fever, cough at this phone interview.

## 2022-09-30 ENCOUNTER — Emergency Department (HOSPITAL_COMMUNITY)
Admission: EM | Admit: 2022-09-30 | Discharge: 2022-09-30 | Disposition: A | Discharge status: Home / Self Care | Payer: Medicare Other | Attending: Emergency Medicine | Admitting: Emergency Medicine

## 2022-09-30 ENCOUNTER — Other Ambulatory Visit: Payer: Self-pay

## 2022-09-30 DIAGNOSIS — D72829 Elevated white blood cell count, unspecified: Secondary | ICD-10-CM | POA: Diagnosis not present

## 2022-09-30 DIAGNOSIS — R739 Hyperglycemia, unspecified: Secondary | ICD-10-CM | POA: Diagnosis not present

## 2022-09-30 DIAGNOSIS — Z87442 Personal history of urinary calculi: Secondary | ICD-10-CM | POA: Insufficient documentation

## 2022-09-30 DIAGNOSIS — R55 Syncope and collapse: Secondary | ICD-10-CM | POA: Diagnosis not present

## 2022-09-30 DIAGNOSIS — Z8616 Personal history of COVID-19: Secondary | ICD-10-CM | POA: Diagnosis not present

## 2022-09-30 DIAGNOSIS — R42 Dizziness and giddiness: Secondary | ICD-10-CM | POA: Diagnosis not present

## 2022-09-30 DIAGNOSIS — X30XXXA Exposure to excessive natural heat, initial encounter: Secondary | ICD-10-CM | POA: Insufficient documentation

## 2022-09-30 DIAGNOSIS — Z87891 Personal history of nicotine dependence: Secondary | ICD-10-CM | POA: Insufficient documentation

## 2022-09-30 DIAGNOSIS — I10 Essential (primary) hypertension: Secondary | ICD-10-CM | POA: Diagnosis not present

## 2022-09-30 DIAGNOSIS — Z85828 Personal history of other malignant neoplasm of skin: Secondary | ICD-10-CM | POA: Diagnosis not present

## 2022-09-30 DIAGNOSIS — Z8546 Personal history of malignant neoplasm of prostate: Secondary | ICD-10-CM | POA: Diagnosis not present

## 2022-09-30 LAB — CBC WITH DIFFERENTIAL/PLATELET
Abs Immature Granulocytes: 0.07 10*3/uL (ref 0.00–0.07)
Basophils Absolute: 0 10*3/uL (ref 0.0–0.1)
Basophils Relative: 0 %
Eosinophils Absolute: 0 10*3/uL (ref 0.0–0.5)
Eosinophils Relative: 0 %
HCT: 47.9 % (ref 39.0–52.0)
Hemoglobin: 15.8 g/dL (ref 13.0–17.0)
Immature Granulocytes: 1 %
Lymphocytes Relative: 7 %
Lymphs Abs: 0.8 10*3/uL (ref 0.7–4.0)
MCH: 31.7 pg (ref 26.0–34.0)
MCHC: 33 g/dL (ref 30.0–36.0)
MCV: 96.2 fL (ref 80.0–100.0)
Monocytes Absolute: 0.8 10*3/uL (ref 0.1–1.0)
Monocytes Relative: 7 %
Neutro Abs: 9.4 10*3/uL — ABNORMAL HIGH (ref 1.7–7.7)
Neutrophils Relative %: 85 %
Platelets: 188 10*3/uL (ref 150–400)
RBC: 4.98 MIL/uL (ref 4.22–5.81)
RDW: 12.7 % (ref 11.5–15.5)
WBC: 11.1 10*3/uL — ABNORMAL HIGH (ref 4.0–10.5)
nRBC: 0 % (ref 0.0–0.2)

## 2022-09-30 LAB — COMPREHENSIVE METABOLIC PANEL
ALT: 26 U/L (ref 0–44)
AST: 23 U/L (ref 15–41)
Albumin: 3.7 g/dL (ref 3.5–5.0)
Alkaline Phosphatase: 63 U/L (ref 38–126)
Anion gap: 11 (ref 5–15)
BUN: 19 mg/dL (ref 8–23)
CO2: 25 mmol/L (ref 22–32)
Calcium: 9.3 mg/dL (ref 8.9–10.3)
Chloride: 105 mmol/L (ref 98–111)
Creatinine, Ser: 0.99 mg/dL (ref 0.61–1.24)
GFR, Estimated: >60 mL/min (ref >60)
Glucose, Bld: 129 mg/dL — ABNORMAL HIGH (ref 70–99)
Potassium: 3.9 mmol/L (ref 3.5–5.1)
Sodium: 141 mmol/L (ref 135–145)
Total Bilirubin: 1 mg/dL (ref 0.3–1.2)
Total Protein: 6.4 g/dL — ABNORMAL LOW (ref 6.5–8.1)

## 2022-09-30 LAB — CK: Total CK: 79 U/L (ref 49–397)

## 2022-09-30 MED ORDER — LACTATED RINGERS IV BOLUS
1000.0000 mL | Freq: Once | INTRAVENOUS | Status: AC
  Administered 2022-09-30: 1000 mL via INTRAVENOUS

## 2022-09-30 NOTE — ED Provider Notes (Signed)
[place] EMERGENCY DEPARTMENT AT [facility] Provider Note  CSN: [id number] Arrival date & time: 09/30/22 1332  Chief Complaint(s) Loss of Consciousness  HPI Mark Li is a 67 y.o. male history of hypertension presenting to the emergency department with lightheadedness, syncope.  Patient reports that he was working outside on the property, was working on tobacco, sitting directly above the engine.  Reports it was very hot outside, was sweating profusely.  He started feeling lightheaded and walked to his truck, called his wife and said he felt bad.  This guided he needed to call paramedics.  Paramedics came and apparently had 1 syncopal episode.  Patient reports he currently feels back to baseline, did get some fluids.  Denies any chest pain at any time, numbness or tingling, focal weakness, headaches, shortness of breath, abdominal pain, leg swelling, fevers or chills, or any other symptoms.   Past Medical History Past Medical History:  Diagnosis Date   Cancer (HCC)    basal and squamous skin cancer   History of kidney stones    Hypertension    Pneumonia    Sleep apnea    cpap   Sleep disturbance 03/20/2013   Patient Active Problem List   Diagnosis Date Noted   Malignant neoplasm of prostate (HCC) 05/26/2022   Acute respiratory disease due to COVID-19 virus 08/10/2018   Essential hypertension 08/10/2018   Sleep disturbance 03/20/2013   Home Medication(s) Prior to Admission medications   Medication Sig Start Date End Date Taking? Authorizing Provider  pravastatin (PRAVACHOL) 10 MG tablet Take by mouth. 07/13/22   [provider]                                                                                                                                    Past Surgical History Past Surgical History:  Procedure Laterality Date   APPENDECTOMY     FRACTURE SURGERY     right foot crushed   PROSTATE BIOPSY N/A 03/28/2022   Procedure: BIOPSY TRANSRECTAL  ULTRASONIC PROSTATE (TUBP);  Surgeon: Bjorn Pippin, MD;  Location: [facility];  Service: Urology;  Laterality: N/A;  45 MINS FOR CASE   Family History Family History  Problem Relation Age of Onset   COPD Mother    Heart disease Father     Social History Social History   Tobacco Use   Smoking status: Former    Types: Cigarettes   Smokeless tobacco: Never   Tobacco comments:    Quit 20 years ago not an everyday smoker even when smoked  Vaping Use   Vaping status: Never Used  Substance Use Topics   Alcohol use: Yes    Comment: 10 drinks (beer, wine, liquor) per week   Drug use: No   Allergies Azithromycin dihydrate, Amoxicillin, Other, Penicillins, Vitamin c, and Zinc  Review of Systems Review of Systems  All other systems reviewed and are negative.   Physical Exam  Vital Signs  I have reviewed the triage vital signs BP 134/84   Pulse 73   Temp 98.4 F (36.9 C) (Oral)   Resp 14   SpO2 100%  Physical Exam Vitals and nursing note reviewed.  Constitutional:      General: He is not in acute distress.    Appearance: Normal appearance.  HENT:     Mouth/Throat:     Mouth: Mucous membranes are moist.  Eyes:     Conjunctiva/sclera: Conjunctivae normal.  Cardiovascular:     Rate and Rhythm: Normal rate and regular rhythm.  Pulmonary:     Effort: Pulmonary effort is normal. No respiratory distress.     Breath sounds: Normal breath sounds.  Abdominal:     General: Abdomen is flat.     Palpations: Abdomen is soft.     Tenderness: There is no abdominal tenderness.  Musculoskeletal:     Right lower leg: No edema.     Left lower leg: No edema.  Skin:    General: Skin is warm and dry.     Capillary Refill: Capillary refill takes less than 2 seconds.  Neurological:     Mental Status: He is alert and oriented to person, place, and time. Mental status is at baseline.     Comments: Cranial nerves II through XII intact, strength 5 out of 5 in the bilateral upper and lower  extremities, no sensory deficit to light touch, no dysmetria on finger-nose-finger testing  Psychiatric:        Mood and Affect: Mood normal.        Behavior: Behavior normal.     ED Results and Treatments Labs (all labs ordered are listed, but only abnormal results are displayed) Labs Reviewed  COMPREHENSIVE METABOLIC PANEL - Abnormal; Notable for the following components:      Result Value   Glucose, Bld 129 (*)    Total Protein 6.4 (*)    All other components within normal limits  CBC WITH DIFFERENTIAL/PLATELET - Abnormal; Notable for the following components:   WBC 11.1 (*)    Neutro Abs 9.4 (*)    All other components within normal limits  CK                                                                                                                          Radiology No results found.  Pertinent labs & imaging results that were available during my care of the patient were reviewed by me and considered in my medical decision making (see MDM for details).  Medications Ordered in ED Medications  lactated ringers bolus 1,000 mL (1,000 mLs Intravenous New Bag/Given 09/30/22 1446)  Procedures Procedures  (including critical care time)  Medical Decision Making / ED Course   MDM:  67 year old male presenting to the emergency department with syncopal episode, heat exposure.  Patient well-appearing, not febrile, vital signs otherwise reassuring.  Examination reassuring including neurologic exam.  Patient reports he feels back at baseline.  Check basic labs, notable for mild leukocytosis, otherwise reassuring.  No AKI, no elevated CK.  Given syncopal episodes will need EKG as well.  No chest pain or palpitations.  Denies any headache, has a normal neurologic exam, low concern for acute intracranial process.  Will give additional fluids.   Discussed with oncoming physician Dr. Anitra Lauth.  Patient is able to ambulate, feels back to baseline and EKG is reassuring, patient would likely be stable for outpatient follow-up with his primary doctor.  Suspect most likely causes heat exposure.      Additional history obtained: -Additional history obtained from ems -External records from outside source obtained and reviewed including: Chart review including previous notes, labs, imaging, consultation notes including prior PMD notes.   Lab Tests: -I ordered, reviewed, and interpreted labs.   The pertinent results include:   Labs Reviewed  COMPREHENSIVE METABOLIC PANEL - Abnormal; Notable for the following components:      Result Value   Glucose, Bld 129 (*)    Total Protein 6.4 (*)    All other components within normal limits  CBC WITH DIFFERENTIAL/PLATELET - Abnormal; Notable for the following components:   WBC 11.1 (*)    Neutro Abs 9.4 (*)    All other components within normal limits  CK    Notable for mild hyperglycemia, mild leukocytosis, normal creatinine and CK.  EKG  pending    Medicines ordered and prescription drug management: Meds ordered this encounter  Medications   lactated ringers bolus 1,000 mL    -I have reviewed the patients home medicines and have made adjustments as needed  Cardiac Monitoring: The patient was maintained on a cardiac monitor.  I personally viewed and interpreted the cardiac monitored which showed an underlying rhythm of: NSR    Reevaluation: After the interventions noted above, I reevaluated the patient and found that their symptoms have improved  Co morbidities that complicate the patient evaluation  Past Medical History:  Diagnosis Date   Cancer (HCC)    basal and squamous skin cancer   History of kidney stones    Hypertension    Pneumonia    Sleep apnea    cpap   Sleep disturbance 03/20/2013      Dispostion: Disposition decision including need for hospitalization  was considered, and patient disposition pending at time of sign out.    Final Clinical Impression(s) / ED Diagnoses Final diagnoses:  Exposure to excessive natural heat, initial encounter     This chart was dictated using voice recognition software.  Despite best efforts to proofread,  errors can occur which can change the documentation meaning.    Lonell Grandchild, MD 09/30/22 [phone]

## 2022-09-30 NOTE — ED Notes (Signed)
Pt ambulated with no assistance.

## 2022-09-30 NOTE — Discharge Instructions (Addendum)
Drink plenty of fluids today rest and stay cool.  Prevent getting overheated in the future and if you have any return of your symptoms such as confusion, garbled speech, passing out you should return to the emergency room immediately.  Otherwise follow-up with your doctor as scheduled or sooner if you do not feel your normal self.

## 2022-09-30 NOTE — ED Provider Notes (Signed)
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 Concern for heat stroke, pt now is assymptomatic.  Labs reassuring. Patient's EKG without acute findings.  He was able to stand and ambulate without any difficulty.  On reassessment patient reports he is feeling fine now other than just being a little tired.  Encourage patient to avoid getting overheated and rest the next few days.  Also cautioned him if he had any return of symptoms he should return to the emergency room.  He and his wife are comfortable with this plan.   Gwyneth Sprout, MD 09/30/22 [phone]

## 2022-09-30 NOTE — ED Triage Notes (Signed)
Pt BIB [facility] from work where he became disoriented. Pt called 911, and they were not able to understand him. First medi on scene witnessed pt's syncopal episode, and pt started coming around while in the truck. A&O X4. Stated felt really bot at work.

## 2022-10-03 ENCOUNTER — Ambulatory Visit (HOSPITAL_BASED_OUTPATIENT_CLINIC_OR_DEPARTMENT_OTHER): Payer: Medicare Other | Admitting: Anesthesiology

## 2022-10-03 ENCOUNTER — Ambulatory Visit (HOSPITAL_BASED_OUTPATIENT_CLINIC_OR_DEPARTMENT_OTHER)
Admission: RE | Admit: 2022-10-03 | Discharge: 2022-10-03 | Disposition: A | Discharge status: Home / Self Care | Payer: Medicare Other | Source: Ambulatory Visit | Attending: Urology | Admitting: Urology

## 2022-10-03 ENCOUNTER — Encounter (HOSPITAL_BASED_OUTPATIENT_CLINIC_OR_DEPARTMENT_OTHER)
Admission: RE | Disposition: A | Discharge status: Home / Self Care | Payer: Self-pay | Source: Ambulatory Visit | Attending: Urology

## 2022-10-03 ENCOUNTER — Other Ambulatory Visit: Payer: Self-pay

## 2022-10-03 ENCOUNTER — Encounter (HOSPITAL_BASED_OUTPATIENT_CLINIC_OR_DEPARTMENT_OTHER): Payer: Self-pay | Admitting: Urology

## 2022-10-03 DIAGNOSIS — N402 Nodular prostate without lower urinary tract symptoms: Secondary | ICD-10-CM | POA: Diagnosis not present

## 2022-10-03 DIAGNOSIS — C61 Malignant neoplasm of prostate: Secondary | ICD-10-CM

## 2022-10-03 DIAGNOSIS — Z01818 Encounter for other preprocedural examination: Secondary | ICD-10-CM

## 2022-10-03 HISTORY — PX: GOLD SEED IMPLANT: SHX6343

## 2022-10-03 HISTORY — PX: SPACE OAR INSTILLATION: SHX6769

## 2022-10-03 LAB — POCT I-STAT, CHEM 8
BUN: 16 mg/dL (ref 8–23)
Calcium, Ion: 1.2 mmol/L (ref 1.15–1.40)
Chloride: 104 mmol/L (ref 98–111)
Creatinine, Ser: 0.8 mg/dL (ref 0.61–1.24)
Glucose, Bld: 86 mg/dL (ref 70–99)
HCT: 48 % (ref 39.0–52.0)
Hemoglobin: 16.3 g/dL (ref 13.0–17.0)
Potassium: 4 mmol/L (ref 3.5–5.1)
Sodium: 141 mmol/L (ref 135–145)
TCO2: 22 mmol/L (ref 22–32)

## 2022-10-03 SURGERY — INSERTION, GOLD SEEDS
Anesthesia: Monitor Anesthesia Care | Site: Prostate

## 2022-10-03 MED ORDER — FENTANYL CITRATE (PF) 100 MCG/2ML IJ SOLN
INTRAMUSCULAR | Status: AC
  Filled 2022-10-03: qty 2

## 2022-10-03 MED ORDER — LACTATED RINGERS IV SOLN: INTRAVENOUS | Status: DC

## 2022-10-03 MED ORDER — PROPOFOL 500 MG/50ML IV EMUL
INTRAVENOUS | Status: DC | PRN
  Administered 2022-10-03: 50 ug/kg/min via INTRAVENOUS

## 2022-10-03 MED ORDER — FENTANYL CITRATE (PF) 100 MCG/2ML IJ SOLN: 25.0000 ug | INTRAMUSCULAR | Status: DC | PRN

## 2022-10-03 MED ORDER — FLEET ENEMA 7-19 GM/118ML RE ENEM: 1.0000 | ENEMA | Freq: Once | RECTAL | Status: DC

## 2022-10-03 MED ORDER — SODIUM CHLORIDE (PF) 0.9 % IJ SOLN
INTRAMUSCULAR | Status: DC | PRN
  Administered 2022-10-03: 10 mL

## 2022-10-03 MED ORDER — PROPOFOL 10 MG/ML IV BOLUS
INTRAVENOUS | Status: AC
  Filled 2022-10-03: qty 20

## 2022-10-03 MED ORDER — FENTANYL CITRATE (PF) 100 MCG/2ML IJ SOLN
INTRAMUSCULAR | Status: DC | PRN
  Administered 2022-10-03 (×4): 12.5 ug via INTRAVENOUS

## 2022-10-03 MED ORDER — CIPROFLOXACIN IN D5W 400 MG/200ML IV SOLN
INTRAVENOUS | Status: AC
  Filled 2022-10-03: qty 200

## 2022-10-03 MED ORDER — LIDOCAINE HCL (PF) 2 % IJ SOLN
INTRAMUSCULAR | Status: AC
  Filled 2022-10-03: qty 5

## 2022-10-03 MED ORDER — PROPOFOL 500 MG/50ML IV EMUL
INTRAVENOUS | Status: AC
  Filled 2022-10-03: qty 50

## 2022-10-03 MED ORDER — MIDAZOLAM HCL 5 MG/5ML IJ SOLN
INTRAMUSCULAR | Status: DC | PRN
  Administered 2022-10-03: 2 mg via INTRAVENOUS

## 2022-10-03 MED ORDER — ONDANSETRON HCL 4 MG/2ML IJ SOLN
INTRAMUSCULAR | Status: DC | PRN
  Administered 2022-10-03: 4 mg via INTRAVENOUS

## 2022-10-03 MED ORDER — ACETAMINOPHEN 500 MG PO TABS
ORAL_TABLET | ORAL | Status: AC
  Filled 2022-10-03: qty 2

## 2022-10-03 MED ORDER — ACETAMINOPHEN 500 MG PO TABS
1000.0000 mg | ORAL_TABLET | Freq: Once | ORAL | Status: AC
  Administered 2022-10-03: 1000 mg via ORAL

## 2022-10-03 MED ORDER — LIDOCAINE HCL (CARDIAC) PF 100 MG/5ML IV SOSY
PREFILLED_SYRINGE | INTRAVENOUS | Status: DC | PRN
  Administered 2022-10-03: 40 mg via INTRAVENOUS

## 2022-10-03 MED ORDER — ONDANSETRON HCL 4 MG/2ML IJ SOLN: 4.0000 mg | Freq: Once | INTRAMUSCULAR | Status: DC | PRN

## 2022-10-03 MED ORDER — PROPOFOL 10 MG/ML IV BOLUS
INTRAVENOUS | Status: DC | PRN
  Administered 2022-10-03 (×3): 20 mg via INTRAVENOUS

## 2022-10-03 MED ORDER — GLYCOPYRROLATE PF 0.2 MG/ML IJ SOSY
PREFILLED_SYRINGE | INTRAMUSCULAR | Status: AC
  Filled 2022-10-03: qty 1

## 2022-10-03 MED ORDER — ONDANSETRON HCL 4 MG/2ML IJ SOLN
INTRAMUSCULAR | Status: AC
  Filled 2022-10-03: qty 2

## 2022-10-03 MED ORDER — LIDOCAINE HCL 1 % IJ SOLN
INTRAMUSCULAR | Status: DC | PRN
  Administered 2022-10-03: 10 mL

## 2022-10-03 MED ORDER — GLYCOPYRROLATE 0.2 MG/ML IJ SOLN
INTRAMUSCULAR | Status: DC | PRN
  Administered 2022-10-03: .2 mg via INTRAVENOUS

## 2022-10-03 MED ORDER — MIDAZOLAM HCL 2 MG/2ML IJ SOLN
INTRAMUSCULAR | Status: AC
  Filled 2022-10-03: qty 2

## 2022-10-03 MED ORDER — CIPROFLOXACIN IN D5W 400 MG/200ML IV SOLN
400.0000 mg | INTRAVENOUS | Status: AC
  Administered 2022-10-03: 400 mg via INTRAVENOUS

## 2022-10-03 SURGICAL SUPPLY — 23 items
BLADE CLIPPER SENSICLIP SURGIC (BLADE) ×1 IMPLANT
CNTNR URN SCR LID CUP LEK RST (MISCELLANEOUS) ×1 IMPLANT
CONT SPEC 4OZ STRL OR WHT (MISCELLANEOUS) ×1
COVER BACK TABLE 60X90IN (DRAPES) ×1 IMPLANT
DRSG TEGADERM 4X4.75 (GAUZE/BANDAGES/DRESSINGS) ×1 IMPLANT
DRSG TEGADERM 8X12 (GAUZE/BANDAGES/DRESSINGS) ×1 IMPLANT
GAUZE SPONGE 4X4 12PLY STRL (GAUZE/BANDAGES/DRESSINGS) ×1 IMPLANT
GLOVE BIO SURGEON STRL SZ7.5 (GLOVE) ×1 IMPLANT
IMPL SPACEOAR VUE SYSTEM (Spacer) ×1 IMPLANT
IMPLANT SPACEOAR VUE SYSTEM (Spacer) ×1 IMPLANT
KIT TURNOVER CYSTO (KITS) ×1 IMPLANT
MARKER GOLD PRELOAD 1.2X3 (Urological Implant) ×1 IMPLANT
MARKER SKIN DUAL TIP RULER LAB (MISCELLANEOUS) ×1 IMPLANT
NDL SPNL 22GX3.5 QUINCKE BK (NEEDLE) ×1 IMPLANT
NEEDLE SPNL 22GX3.5 QUINCKE BK (NEEDLE) ×1
SEED GOLD PRELOAD 1.2X3 (Urological Implant) ×1 IMPLANT
SHEATH ULTRASOUND LTX NONSTRL (SHEATH) IMPLANT
SLEEVE SCD COMPRESS KNEE MED (STOCKING) ×1 IMPLANT
SURGILUBE 2OZ TUBE FLIPTOP (MISCELLANEOUS) ×1 IMPLANT
SYR 10ML LL (SYRINGE) ×1 IMPLANT
SYR CONTROL 10ML LL (SYRINGE) ×1 IMPLANT
TOWEL OR 17X24 6PK STRL BLUE (TOWEL DISPOSABLE) ×1 IMPLANT
UNDERPAD 30X36 HEAVY ABSORB (UNDERPADS AND DIAPERS) ×1 IMPLANT

## 2022-10-03 NOTE — Transfer of Care (Signed)
Immediate Anesthesia Transfer of Care Note  Patient: Mark Li  Procedure(s) Performed: Procedure(s) (LRB): GOLD SEED IMPLANT (N/A) SPACE OAR INSTILLATION (N/A)  Patient Location: PACU  Anesthesia Type: MAC  Level of Consciousness: awake, sedated, patient cooperative and responds to stimulation  Airway & Oxygen Therapy: Patient Spontanous Breathing and Patient connected to [place] oxygen  Post-op Assessment: Report given to PACU RN, Post -op Vital signs reviewed and stable and Patient moving all extremities  Post vital signs: Reviewed and stable  Complications: No apparent anesthesia complications

## 2022-10-03 NOTE — Anesthesia Procedure Notes (Signed)
Procedure Name: MAC Date/Time: 10/03/2022 10:12 AM  Performed by: Jessica Priest, CRNAPre-anesthesia Checklist: Timeout performed, Patient being monitored, Emergency Drugs available and Patient identified Patient Re-evaluated:Patient Re-evaluated prior to induction Oxygen Delivery Method: Simple face mask Preoxygenation: Pre-oxygenation with 100% oxygen Induction Type: IV induction Placement Confirmation: breath sounds checked- equal and bilateral, CO2 detector and positive ETCO2

## 2022-10-03 NOTE — H&P (Signed)
I have prostate cancer.     Mark Li returns today discuss his recent biopsy which was done for an enlarging right apical nodule with a PIRADS 4 lesion on MRI in this area and a chronically elevated PSA.   Prostate volume 65ml.   PSA was 6.5 but stable since 2010.   Path: GG2 in 10% of right mid prostate nodule that was hypoechoic on [place] and consistent with the MRI lesion and the palpable nodule. the 12 template biopsies were negative. There was insufficient tissue for a Prolaris test.   Stage T2a Nx Mx. MRI was negative for nodes.   [facility] nomogram: 67% OCD, 31% ECE, 3% LNI and 2% SVI.   CAPRA 3.   IPSS is 3.   SHIM is 25.    ALLERGIES: Amoxicillin TABS Penicillin    MEDICATIONS: Lisinopril  Celebrex  Zepbound     GU PSH: Prostate Needle Biopsy - 03/28/2022       PSH Notes: Preventive medication therapy needed, Appendectomy, Ankle Repair   NON-GU PSH: Appendectomy - 2010     GU PMH: Elevated PSA, He has a stable PSA over 10+ years but has a palpable nodule that is larger than in 1/22 and MR evidence of a PIRADS 4 lesion in the area of the nodule. I discussed options for management including a return to [facility] for an MR fusion biopsy because we can't use his current study in our system or a cognative fusion biopsy which I think would be very doable based on his exam and MRI findings. He would like sedation so I will get him set up in the OR for the biopsy. I reviewed the procedure and the risks of bleeding, infection and difficulty voiding. - 02/22/2022, His PSA is minimally changed over the last 10 years. He has a very small indurated area at the right apex that is not documented on prior exams. I am going to have him return in 6 months with a repeat PSA for another exam. , - 2022, Elevated prostate specific antigen (PSA), - 2014 Nocturia (Stable) - 02/22/2022, He has nocturia x 2. , - 2022 Prostate nodule w/ LUTS - 02/22/2022, - 2022 Urinary Frequency - 02/22/2022 Ureteral  calculus - 07/05/2021 Ureteral obstruction secondary to calculous - 07/05/2021 Other microscopic hematuria, Microscopic hematuria - 2014      PMH Notes:  1898-02-27 00:00:00 - Note: Normal Routine History And Physical Adult  2009-07-29 16:45:58 - Note: Neoplasm Of The Prostate Gland  2008-12-28 14:43:34 - Note: Arthritis   NON-GU PMH: Personal history of other endocrine, nutritional and metabolic disease, History of hypercholesterolemia - 2014 Personal history of other specified conditions, History of heartburn - 2014 Arthritis GERD Hypercholesterolemia Hypertension Sleep Apnea    FAMILY HISTORY: 1 Daughter - Other 1 son - Other Acute Myocardial Infarction - Father, Uncle Breast Cancer - Mother copd - Mother Family Health Status - Father alive at age 72 - Runs In Family Family Health Status - Mother's Age - Runs In Family Family Health Status Number - Runs In Family nephrolithiasis - Uncle, Father Prostate Cancer - Uncle   SOCIAL HISTORY: Marital Status: Married Preferred Language: English; Race: White Current Smoking Status: Patient does not smoke anymore. Has not smoked since 02/28/1988.   Tobacco Use Assessment Completed: Used Tobacco in last 30 days? Drinks 4+ caffeinated drinks per day.     Notes: Tobacco Use, Caffeine Use, Marital History - Currently Married, Alcohol Use, Occupation:   REVIEW OF SYSTEMS:    GU Review Male:  Patient denies frequent urination, hard to postpone urination, burning/ pain with urination, get up at night to urinate, leakage of urine, stream starts and stops, trouble starting your stream, have to strain to urinate , erection problems, and penile pain.  Gastrointestinal (Upper):   Patient denies nausea, vomiting, and indigestion/ heartburn.  Gastrointestinal (Lower):   Patient denies diarrhea and constipation.  Constitutional:   Patient denies fever, night sweats, weight loss, and fatigue.  Skin:   Patient denies itching and skin rash/ lesion.   Eyes:   Patient denies blurred vision and double vision.  Ears/ Nose/ Throat:   Patient denies sore throat and sinus problems.  Hematologic/Lymphatic:   Patient denies swollen glands and easy bruising.  Cardiovascular:   Patient denies leg swelling and chest pains.  Respiratory:   Patient denies cough and shortness of breath.  Endocrine:   Patient denies excessive thirst.  Musculoskeletal:   Patient denies back pain and joint pain.  Neurological:   Patient denies headaches and dizziness.  Psychologic:   Patient denies depression and anxiety.   VITAL SIGNS: None   Complexity of Data:  Records Review:   AUA Symptom Score, Pathology Reports, Previous Patient Records, IIEF Score   12/18/21 11/09/20 09/01/20 01/13/20 07/29/09 12/28/08  PSA  Total PSA 6.5 ng/ml 5.24 ng/mL 6.6 ng/ml 7.1 ng/ml 6.83  6.35   Free PSA  0.83 ng/mL    0.70   % Free PSA  16 % PSA    11.0     PROCEDURES: None   ASSESSMENT:      ICD-10 Details  1 GU:   Prostate Cancer - C61 Chronic, Threat to Bodily Function - He has very low volume GG2 T2a Nx Mx intermediate risk prostate cancer with a well defined lesion on exam and imaging. He has minimal LUTS and no ED.   I discussed options including surveillance and had hoped to have a Prolaris test to help guide that decision but there was insufficient tissue. I explained the pros and cons of surveillance and explained that he is not the typical candidate and if he was interested in pursuing that the a close follow up protocol with early rebiopsy would be prudent.   I discusses RALP and reviewed the risks and benefits. He is not interested in surgical therapy.   I discussed brachytherapy and EXRT and reviewed the risks and benefits of both and the use of SpaceOAR and it's added risks. His prostate is a bit large for brachytherapy but I will leave that judgement to Dr. Kathrynn Running but I did discussed downsizing with finasteride or possibly a short course of ADT and reviewed those  side effects.   I mentioned cryotherapy and HIFU but his prostate seems a bit large for whole gland therapy; however he does have a very isolated, easy to image small cancer and is a potential candidate for focal therapy. I discussed that with him and explained that if he is interested in that it should be done in a protocol setting and that he might require additional biopsies prior to treatment or follow up biopsies after treatment. He is interested in discussing that so I will refer him to Dr. Harold Barban at [facility].   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 Aretta Nip with [facility] for consideration of prostate cancer focal therapy.   Return Visit/Planned Activity: Next Available Appointment - Consult             Note: Appt with Dr. Kathrynn Running to discuss radiation therapy options.

## 2022-10-03 NOTE — Anesthesia Preprocedure Evaluation (Signed)
Anesthesia Evaluation  Patient identified by MRN, date of birth, ID band Patient awake    Reviewed: Allergy & Precautions, NPO status , Patient's Chart, lab work & pertinent test results  Airway Mallampati: II  TM Distance: >3 FB Neck ROM: Full    Dental  (+) Teeth Intact, Dental Advisory Given   Pulmonary sleep apnea and Continuous Positive Airway Pressure Ventilation , former smoker   Pulmonary exam normal breath sounds clear to auscultation       Cardiovascular hypertension, Normal cardiovascular exam Rhythm:Regular Rate:Normal     Neuro/Psych negative neurological ROS  negative psych ROS   GI/Hepatic negative GI ROS, Neg liver ROS,,,  Endo/Other  negative endocrine ROS    Renal/GU negative Renal ROS   PROSTATE CANCER    Musculoskeletal negative musculoskeletal ROS (+)    Abdominal   Peds  Hematology negative hematology ROS (+)   Anesthesia Other Findings Day of surgery medications reviewed with the patient.  Reproductive/Obstetrics                              Anesthesia Physical Anesthesia Plan  ASA: 2  Anesthesia Plan: MAC   Post-op Pain Management: Tylenol PO (pre-op)*   Induction: Intravenous  PONV Risk Score and Plan: 1 and TIVA and Treatment may vary due to age or medical condition  Airway Management Planned: Natural Airway and Simple Face Mask  Additional Equipment:   Intra-op Plan:   Post-operative Plan:   Informed Consent: I have reviewed the patients History and Physical, chart, labs and discussed the procedure including the risks, benefits and alternatives for the proposed anesthesia with the patient or authorized representative who has indicated his/her understanding and acceptance.     Dental advisory given  Plan Discussed with: CRNA  Anesthesia Plan Comments:          Anesthesia Quick Evaluation

## 2022-10-03 NOTE — Interval H&P Note (Signed)
History and Physical Interval Note:  10/03/2022 10:04 AM  Mark Li  has presented today for surgery, with the diagnosis of PROSTATE CANCER.  The various methods of treatment have been discussed with the patient and family. After consideration of risks, benefits and other options for treatment, the patient has consented to  Procedure(s) with comments: GOLD SEED IMPLANT (N/A) SPACE OAR INSTILLATION (N/A) - 30 MINS FOR CASE as a surgical intervention.  The patient's history has been reviewed, patient examined, no change in status, stable for surgery.  I have reviewed the patient's chart and labs.  Questions were answered to the patient's satisfaction.     Crist Fat

## 2022-10-03 NOTE — Discharge Instructions (Addendum)
For several days the patient:  should increase his fluid intake and limit strenuous activity. he might have mild discomfort at the base of his penis or in his rectum. he might have blood in his urine or blood in his bowel movements.  For 2-3 months he might have blood in his ejaculate (semen).  Call the office immedicately:  for blood clots in the urine or bowel movements,  difficulty urinating,  inability to urinate,  urinary retention,  painful or frequent urination,  fever, chills,  nausea, vomiting, other illness.    The prostate biopsy pathology reports are usually available within 3-5 working days, unless a pathologic second opinion is required, which may take 7-14 days.   Contact [facility] to check on the status of his biopsy if he has not heard from [place] within 7 days.  [facility]:  [phone]   Post Anesthesia Home Care Instructions  Activity: Get plenty of rest for the remainder of the day. A responsible individual must stay with you for 24 hours following the procedure.  For the next 24 hours, DO NOT: -Drive a car -Advertising copywriter -Drink alcoholic beverages -Take any medication unless instructed by your physician -Make any legal decisions or sign important papers.  Meals: Start with liquid foods such as gelatin or soup. Progress to regular foods as tolerated. Avoid greasy, spicy, heavy foods. If nausea and/or vomiting occur, drink only clear liquids until the nausea and/or vomiting subsides. Call your physician if vomiting continues.  Special Instructions/Symptoms: Your throat may feel dry or sore from the anesthesia or the breathing tube placed in your throat during surgery. If this causes discomfort, gargle with warm salt water. The discomfort should disappear within 24 hours.  No acetaminophen/Tylenol until after 3:15 pm today if needed.

## 2022-10-03 NOTE — Anesthesia Postprocedure Evaluation (Signed)
Anesthesia Post Note  Patient: Mark Li  Procedure(s) Performed: GOLD SEED IMPLANT (Prostate) SPACE OAR INSTILLATION (Prostate)     Patient location during evaluation: PACU Anesthesia Type: MAC Level of consciousness: awake and alert Pain management: pain level controlled Vital Signs Assessment: post-procedure vital signs reviewed and stable Respiratory status: spontaneous breathing, nonlabored ventilation, respiratory function stable and patient connected to nasal cannula oxygen Cardiovascular status: stable and blood pressure returned to baseline Postop Assessment: no apparent nausea or vomiting Anesthetic complications: no   No notable events documented.  Last Vitals:  Vitals:   10/03/22 1115 10/03/22 1126  BP: (!) 136/94 (!) 134/92  Pulse: (!) 58 (!) 54  Resp: 18 16  Temp: (!) 36.3 C 36.6 C  SpO2: 96% 98%    Last Pain:  Vitals:   10/03/22 1126  TempSrc:   PainSc: 0-No pain                 Collene Schlichter

## 2022-10-03 NOTE — Op Note (Signed)
Preoperative diagnosis: Clinically localized adenocarcinoma of the prostate   Postoperative diagnosis: Clinically localized adenocarcinoma of the prostate  Procedure: 1) Placement of fiducial markers into prostate                    2) Insertion of SpaceOAR hydrogel   Surgeon: Berniece Salines, M.D.  Anesthesia: General  EBL: Minimal  Complications: None  Indication: Mark Li is a 67 y.o. gentleman with clinically localized prostate cancer. After discussing management options for treatment, he elected to proceed with radiotherapy. He presents today for the above procedures. The potential risks, complications, alternative options, and expected recovery course have been discussed in detail with the patient and he has provided informed consent to proceed.  Description of procedure: The patient was administered preoperative antibiotics, placed in the dorsal lithotomy position, and prepped and draped in the usual sterile fashion. Next, transrectal ultrasonography was utilized to visualize the prostate. Three gold fiducial markers were then placed into the prostate via transperineal needles under ultrasound guidance at the right apex, right base, and left mid gland under direct ultrasound guidance. A site in the midline was then selected on the perineum for placement of an 18 g needle with saline. The needle was advanced above the rectum and below Denonvillier's fascia to the mid gland and confirmed to be in the midline on transverse imaging. One cc of saline was injected confirming appropriate expansion of this space. A total of 5 cc of saline was then injected to open the space further bilaterally. The saline syringe was then removed and the SpaceOAR hydrogel was injected with good distribution bilaterally. He tolerated the procedure well and without complications. He was given a voiding trial prior to discharge from the PACU.

## 2022-10-04 ENCOUNTER — Encounter (HOSPITAL_BASED_OUTPATIENT_CLINIC_OR_DEPARTMENT_OTHER): Payer: Self-pay | Admitting: Urology

## 2022-10-04 ENCOUNTER — Telehealth: Payer: Self-pay | Admitting: *Deleted

## 2022-10-04 NOTE — Telephone Encounter (Signed)
CALLED PATIENT TO REMIND OF SIM APPT. FOR 10-05-22- ARRIVAL TIME- 2:45 PM @ [facility], INFORMED PATIENT TO ARRIVE WITH A FULL BLADDER, SPOKE WITH PATIENT AND HE IS AWARE OF THIS APPT. AND THE INSTRUCTIONS

## 2022-10-05 ENCOUNTER — Ambulatory Visit: Admission: RE | Admit: 2022-10-05 | Payer: Medicare Other | Source: Ambulatory Visit | Admitting: Radiation Oncology

## 2022-10-06 ENCOUNTER — Other Ambulatory Visit: Payer: Self-pay

## 2022-10-06 ENCOUNTER — Ambulatory Visit
Admission: RE | Admit: 2022-10-06 | Discharge: 2022-10-06 | Disposition: A | Discharge status: Home / Self Care | Payer: Medicare Other | Source: Ambulatory Visit | Attending: Radiation Oncology | Admitting: Radiation Oncology

## 2022-10-06 DIAGNOSIS — C61 Malignant neoplasm of prostate: Secondary | ICD-10-CM | POA: Diagnosis present

## 2022-10-06 DIAGNOSIS — Z510 Encounter for antineoplastic radiation therapy: Secondary | ICD-10-CM | POA: Diagnosis present

## 2022-10-06 NOTE — Progress Notes (Signed)
  Radiation Oncology         [phone] ________________________________  Name: Mark Li MRN: [id number]  Date: 10/06/2022  DOB: 1956/02/28  SIMULATION AND TREATMENT PLANNING NOTE    ICD-10-CM   1. Malignant neoplasm of prostate (HCC)  C61       DIAGNOSIS:  67 y.o. gentleman with Stage T2a adenocarcinoma of the prostate with Gleason score of 4+3, and PSA of 6.5.  NARRATIVE:  The patient was brought to the CT Simulation planning suite.  Identity was confirmed.  All relevant records and images related to the planned course of therapy were reviewed.  The patient freely provided informed written consent to proceed with treatment after reviewing the details related to the planned course of therapy. The consent form was witnessed and verified by the simulation staff.  Then, the patient was set-up in a stable reproducible supine position for radiation therapy.  A vacuum lock pillow device was custom fabricated to position his legs in a reproducible immobilized position.  Then, supervised the performance of a urethrogram under sterile conditions to identify the prostatic apex.  CT images were obtained.  Surface markings were placed.  The CT images were loaded into the planning software.  Then the prostate target and avoidance structures including the rectum, bladder, bowel and hips were contoured.  Treatment planning then occurred.  The radiation prescription was entered and confirmed.  A total of one complex treatment devices was fabricated. I have requested : Intensity Modulated Radiotherapy (IMRT) is medically necessary for this case for the following reason:  Rectal sparing.  I have requested daily cone beam CT volumetric image gudiance to track gold fiducial posiitoning along with bladder and rectal filling, this is medically necessary to assure accurate positioning of high dose radiation.  PLAN:  The patient will receive 70 Gy in 28 fractions.  ________________________________  Artist Pais  Kathrynn Running, M.D.

## 2022-10-09 DIAGNOSIS — Z510 Encounter for antineoplastic radiation therapy: Secondary | ICD-10-CM | POA: Diagnosis not present

## 2022-10-19 ENCOUNTER — Ambulatory Visit: Payer: Medicare Other

## 2022-10-20 ENCOUNTER — Ambulatory Visit: Payer: Medicare Other

## 2022-10-23 ENCOUNTER — Ambulatory Visit
Admission: RE | Admit: 2022-10-23 | Discharge: 2022-10-23 | Disposition: A | Discharge status: Home / Self Care | Payer: Medicare Other | Source: Ambulatory Visit | Attending: Radiation Oncology | Admitting: Radiation Oncology

## 2022-10-23 ENCOUNTER — Ambulatory Visit: Payer: Medicare Other

## 2022-10-23 ENCOUNTER — Other Ambulatory Visit: Payer: Self-pay

## 2022-10-23 DIAGNOSIS — Z510 Encounter for antineoplastic radiation therapy: Secondary | ICD-10-CM | POA: Diagnosis not present

## 2022-10-23 LAB — RAD ONC ARIA SESSION SUMMARY
Course Elapsed Days: 0
Plan Fractions Treated to Date: 1
Plan Prescribed Dose Per Fraction: 2.5 Gy
Plan Total Fractions Prescribed: 28
Plan Total Prescribed Dose: 70 Gy
Reference Point Dosage Given to Date: 2.5 Gy
Reference Point Session Dosage Given: 2.5 Gy
Session Number: 1

## 2022-10-24 ENCOUNTER — Other Ambulatory Visit: Payer: Self-pay

## 2022-10-24 ENCOUNTER — Telehealth: Payer: Self-pay | Admitting: Radiation Oncology

## 2022-10-24 ENCOUNTER — Ambulatory Visit
Admission: RE | Admit: 2022-10-24 | Discharge: 2022-10-24 | Disposition: A | Discharge status: Home / Self Care | Payer: Medicare Other | Source: Ambulatory Visit | Attending: Radiation Oncology | Admitting: Radiation Oncology

## 2022-10-24 ENCOUNTER — Telehealth: Payer: Self-pay

## 2022-10-24 DIAGNOSIS — Z510 Encounter for antineoplastic radiation therapy: Secondary | ICD-10-CM | POA: Diagnosis not present

## 2022-10-24 LAB — RAD ONC ARIA SESSION SUMMARY
Course Elapsed Days: 1
Plan Fractions Treated to Date: 2
Plan Prescribed Dose Per Fraction: 2.5 Gy
Plan Total Fractions Prescribed: 28
Plan Total Prescribed Dose: 70 Gy
Reference Point Dosage Given to Date: 5 Gy
Reference Point Session Dosage Given: 2.5 Gy
Session Number: 2

## 2022-10-24 NOTE — Telephone Encounter (Signed)
RN returned call to patient who was concerned over a bleeding tooth.  He reports that after first radiation treatment for prostate cancer on 10/23/2022 he noticed during evening meal that his tooth was bleeding.  RN asked if he meant his gums surrounding the tooth he said no it was his tooth.  RN advised the patient to follow up with his dentist since he only had one treatment and that it was to his prostate not near his face.  Denies pain to area of tooth.  He just felt he needed to inform staff as it was the only change from his normal life routine.  He expressed that it is no longer an issue but appreciate the call and will notify his dentist.

## 2022-10-24 NOTE — Telephone Encounter (Signed)
8/27 @ 9:25 am Patient left voicemail with concerns of bleeding from his teeth after his first radiation treatment on yesterday.  Patient requested a call back.  Email sent to [facility], so they are aware.

## 2022-10-25 ENCOUNTER — Ambulatory Visit
Admission: RE | Admit: 2022-10-25 | Discharge: 2022-10-25 | Disposition: A | Discharge status: Home / Self Care | Payer: Medicare Other | Source: Ambulatory Visit | Attending: Radiation Oncology | Admitting: Radiation Oncology

## 2022-10-25 ENCOUNTER — Other Ambulatory Visit: Payer: Self-pay

## 2022-10-25 DIAGNOSIS — Z510 Encounter for antineoplastic radiation therapy: Secondary | ICD-10-CM | POA: Diagnosis not present

## 2022-10-25 LAB — RAD ONC ARIA SESSION SUMMARY
Course Elapsed Days: 2
Plan Fractions Treated to Date: 3
Plan Prescribed Dose Per Fraction: 2.5 Gy
Plan Total Fractions Prescribed: 28
Plan Total Prescribed Dose: 70 Gy
Reference Point Dosage Given to Date: 7.5 Gy
Reference Point Session Dosage Given: 2.5 Gy
Session Number: 3

## 2022-10-26 ENCOUNTER — Ambulatory Visit
Admission: RE | Admit: 2022-10-26 | Discharge: 2022-10-26 | Disposition: A | Discharge status: Home / Self Care | Payer: Medicare Other | Source: Ambulatory Visit | Attending: Radiation Oncology | Admitting: Radiation Oncology

## 2022-10-26 ENCOUNTER — Other Ambulatory Visit: Payer: Self-pay

## 2022-10-26 DIAGNOSIS — Z510 Encounter for antineoplastic radiation therapy: Secondary | ICD-10-CM | POA: Diagnosis not present

## 2022-10-26 LAB — RAD ONC ARIA SESSION SUMMARY
Course Elapsed Days: 3
Plan Fractions Treated to Date: 4
Plan Prescribed Dose Per Fraction: 2.5 Gy
Plan Total Fractions Prescribed: 28
Plan Total Prescribed Dose: 70 Gy
Reference Point Dosage Given to Date: 10 Gy
Reference Point Session Dosage Given: 2.5 Gy
Session Number: 4

## 2022-10-27 ENCOUNTER — Ambulatory Visit
Admission: RE | Admit: 2022-10-27 | Discharge: 2022-10-27 | Disposition: A | Discharge status: Home / Self Care | Payer: Medicare Other | Source: Ambulatory Visit | Attending: Radiation Oncology | Admitting: Radiation Oncology

## 2022-10-27 ENCOUNTER — Other Ambulatory Visit: Payer: Self-pay

## 2022-10-27 DIAGNOSIS — Z510 Encounter for antineoplastic radiation therapy: Secondary | ICD-10-CM | POA: Diagnosis not present

## 2022-10-27 LAB — RAD ONC ARIA SESSION SUMMARY
Course Elapsed Days: 4
Plan Fractions Treated to Date: 5
Plan Prescribed Dose Per Fraction: 2.5 Gy
Plan Total Fractions Prescribed: 28
Plan Total Prescribed Dose: 70 Gy
Reference Point Dosage Given to Date: 12.5 Gy
Reference Point Session Dosage Given: 2.5 Gy
Session Number: 5

## 2022-10-31 ENCOUNTER — Ambulatory Visit
Admission: RE | Admit: 2022-10-31 | Discharge: 2022-10-31 | Disposition: A | Discharge status: Home / Self Care | Payer: Medicare Other | Source: Ambulatory Visit | Attending: Radiation Oncology | Admitting: Radiation Oncology

## 2022-10-31 ENCOUNTER — Other Ambulatory Visit: Payer: Self-pay

## 2022-10-31 DIAGNOSIS — C61 Malignant neoplasm of prostate: Secondary | ICD-10-CM | POA: Diagnosis present

## 2022-10-31 DIAGNOSIS — Z79899 Other long term (current) drug therapy: Secondary | ICD-10-CM | POA: Diagnosis not present

## 2022-10-31 DIAGNOSIS — Z510 Encounter for antineoplastic radiation therapy: Secondary | ICD-10-CM | POA: Insufficient documentation

## 2022-10-31 LAB — RAD ONC ARIA SESSION SUMMARY
Course Elapsed Days: 8
Plan Fractions Treated to Date: 6
Plan Prescribed Dose Per Fraction: 2.5 Gy
Plan Total Fractions Prescribed: 28
Plan Total Prescribed Dose: 70 Gy
Reference Point Dosage Given to Date: 15 Gy
Reference Point Session Dosage Given: 2.5 Gy
Session Number: 6

## 2022-11-01 ENCOUNTER — Ambulatory Visit
Admission: RE | Admit: 2022-11-01 | Discharge: 2022-11-01 | Disposition: A | Discharge status: Home / Self Care | Payer: Medicare Other | Source: Ambulatory Visit | Attending: Radiation Oncology | Admitting: Radiation Oncology

## 2022-11-01 ENCOUNTER — Other Ambulatory Visit: Payer: Self-pay

## 2022-11-01 DIAGNOSIS — Z510 Encounter for antineoplastic radiation therapy: Secondary | ICD-10-CM | POA: Diagnosis not present

## 2022-11-01 LAB — RAD ONC ARIA SESSION SUMMARY
Course Elapsed Days: 9
Plan Fractions Treated to Date: 7
Plan Prescribed Dose Per Fraction: 2.5 Gy
Plan Total Fractions Prescribed: 28
Plan Total Prescribed Dose: 70 Gy
Reference Point Dosage Given to Date: 17.5 Gy
Reference Point Session Dosage Given: 2.5 Gy
Session Number: 7

## 2022-11-02 ENCOUNTER — Other Ambulatory Visit: Payer: Self-pay

## 2022-11-02 ENCOUNTER — Ambulatory Visit
Admission: RE | Admit: 2022-11-02 | Discharge: 2022-11-02 | Disposition: A | Discharge status: Home / Self Care | Payer: Medicare Other | Source: Ambulatory Visit | Attending: Radiation Oncology | Admitting: Radiation Oncology

## 2022-11-02 DIAGNOSIS — Z510 Encounter for antineoplastic radiation therapy: Secondary | ICD-10-CM | POA: Diagnosis not present

## 2022-11-02 LAB — RAD ONC ARIA SESSION SUMMARY
Course Elapsed Days: 10
Plan Fractions Treated to Date: 8
Plan Prescribed Dose Per Fraction: 2.5 Gy
Plan Total Fractions Prescribed: 28
Plan Total Prescribed Dose: 70 Gy
Reference Point Dosage Given to Date: 20 Gy
Reference Point Session Dosage Given: 2.5 Gy
Session Number: 8

## 2022-11-03 ENCOUNTER — Ambulatory Visit: Payer: Medicare Other | Admitting: Family Medicine

## 2022-11-03 ENCOUNTER — Ambulatory Visit
Admission: RE | Admit: 2022-11-03 | Discharge: 2022-11-03 | Disposition: A | Discharge status: Home / Self Care | Payer: Medicare Other | Source: Ambulatory Visit | Attending: Radiation Oncology

## 2022-11-03 ENCOUNTER — Ambulatory Visit
Admission: RE | Admit: 2022-11-03 | Discharge: 2022-11-03 | Disposition: A | Discharge status: Home / Self Care | Payer: Medicare Other | Source: Ambulatory Visit | Attending: Radiation Oncology | Admitting: Radiation Oncology

## 2022-11-03 ENCOUNTER — Other Ambulatory Visit: Payer: Self-pay

## 2022-11-03 DIAGNOSIS — Z510 Encounter for antineoplastic radiation therapy: Secondary | ICD-10-CM | POA: Diagnosis not present

## 2022-11-03 LAB — RAD ONC ARIA SESSION SUMMARY
Course Elapsed Days: 11
Plan Fractions Treated to Date: 9
Plan Prescribed Dose Per Fraction: 2.5 Gy
Plan Total Fractions Prescribed: 28
Plan Total Prescribed Dose: 70 Gy
Reference Point Dosage Given to Date: 22.5 Gy
Reference Point Session Dosage Given: 2.5 Gy
Session Number: 9

## 2022-11-06 ENCOUNTER — Ambulatory Visit
Admission: RE | Admit: 2022-11-06 | Discharge: 2022-11-06 | Disposition: A | Discharge status: Home / Self Care | Payer: Medicare Other | Source: Ambulatory Visit | Attending: Radiation Oncology | Admitting: Radiation Oncology

## 2022-11-06 ENCOUNTER — Other Ambulatory Visit: Payer: Self-pay

## 2022-11-06 ENCOUNTER — Ambulatory Visit
Admission: RE | Admit: 2022-11-06 | Discharge: 2022-11-06 | Disposition: A | Discharge status: Home / Self Care | Payer: Medicare Other | Source: Ambulatory Visit | Attending: Radiation Oncology

## 2022-11-06 ENCOUNTER — Telehealth: Payer: Self-pay

## 2022-11-06 ENCOUNTER — Other Ambulatory Visit: Payer: Self-pay | Admitting: Radiation Oncology

## 2022-11-06 ENCOUNTER — Encounter: Payer: Self-pay | Admitting: Family Medicine

## 2022-11-06 ENCOUNTER — Ambulatory Visit (INDEPENDENT_AMBULATORY_CARE_PROVIDER_SITE_OTHER): Payer: Medicare Other | Admitting: Family Medicine

## 2022-11-06 VITALS — BP 132/85 | HR 62 | Ht 71.0 in | Wt 232.8 lb

## 2022-11-06 DIAGNOSIS — I10 Essential (primary) hypertension: Secondary | ICD-10-CM

## 2022-11-06 DIAGNOSIS — C61 Malignant neoplasm of prostate: Secondary | ICD-10-CM

## 2022-11-06 DIAGNOSIS — G8929 Other chronic pain: Secondary | ICD-10-CM

## 2022-11-06 DIAGNOSIS — N3942 Incontinence without sensory awareness: Secondary | ICD-10-CM | POA: Diagnosis not present

## 2022-11-06 DIAGNOSIS — M546 Pain in thoracic spine: Secondary | ICD-10-CM

## 2022-11-06 DIAGNOSIS — Z510 Encounter for antineoplastic radiation therapy: Secondary | ICD-10-CM | POA: Diagnosis not present

## 2022-11-06 DIAGNOSIS — E782 Mixed hyperlipidemia: Secondary | ICD-10-CM | POA: Diagnosis not present

## 2022-11-06 DIAGNOSIS — G4733 Obstructive sleep apnea (adult) (pediatric): Secondary | ICD-10-CM

## 2022-11-06 LAB — RAD ONC ARIA SESSION SUMMARY
Course Elapsed Days: 14
Plan Fractions Treated to Date: 10
Plan Prescribed Dose Per Fraction: 2.5 Gy
Plan Total Fractions Prescribed: 28
Plan Total Prescribed Dose: 70 Gy
Reference Point Dosage Given to Date: 25 Gy
Reference Point Session Dosage Given: 2.5 Gy
Session Number: 10

## 2022-11-06 LAB — URINALYSIS, COMPLETE (UACMP) WITH MICROSCOPIC
Bilirubin Urine: NEGATIVE
Glucose, UA: NEGATIVE mg/dL
Ketones, ur: NEGATIVE mg/dL
Leukocytes,Ua: NEGATIVE
Nitrite: POSITIVE — AB
Protein, ur: NEGATIVE mg/dL
Specific Gravity, Urine: 1.019 (ref 1.005–1.030)
pH: 5 (ref 5.0–8.0)

## 2022-11-06 MED ORDER — INCONTINENCE BRIEF LARGE MISC: 1 refills | Status: DC

## 2022-11-06 MED ORDER — CIPROFLOXACIN HCL 500 MG PO TABS: 500.0000 mg | ORAL_TABLET | Freq: Two times a day (BID) | ORAL | 0 refills | Status: AC

## 2022-11-06 NOTE — Assessment & Plan Note (Signed)
On low-dose of statin.  Intolerant of higher doses.  Will recheck cholesterol level today.

## 2022-11-06 NOTE — Assessment & Plan Note (Signed)
Diet controlled since weight loss.  Continue to monitor

## 2022-11-06 NOTE — Assessment & Plan Note (Signed)
Midthoracic pain.  Nonradiating.  Worse with straining to urinate.  Has had relief of similar symptoms in the past with chiropractor. Referral to chiropractor replaced, although advised patient this is often not necessary.

## 2022-11-06 NOTE — Patient Instructions (Signed)
It was nice to see you today,  We addressed the following topics today: - we will recheck your cholesterol level.   - we will follow back up in one month to discuss your chronic medical issues - I will send in a referral for a chiropractor.   - I will send in an order for incontinence supplies   Have a great day,  Frederic Jericho, MD

## 2022-11-06 NOTE — Assessment & Plan Note (Signed)
Improved with weight loss.  Continues to wear CPAP although states he does not "need it" anymore.

## 2022-11-06 NOTE — Assessment & Plan Note (Signed)
Patient receiving radiation therapy t at [facility] currently.  Follows up with oncology regularly.

## 2022-11-06 NOTE — Assessment & Plan Note (Signed)
Will put an order for incontinence supplies.  Related to his radiation therapy.  Patient currently states he is discussing initiating Flomax with his oncologist.  Had a urinalysis today at his oncology appointment.

## 2022-11-06 NOTE — Progress Notes (Unsigned)
New Patient Office Visit  Subjective    Patient ID: Mark Li, male    DOB: 1955-08-21  Age: 67 y.o. MRN: [id number]  CC:  Chief Complaint  Patient presents with   New Patient (Initial Visit)    HPI KINCAID PEBLEY presents to establish care  Patient was previously seeing [facility] off [address].  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    Patient generally healthy but recently was diagnosed with prostate adenocarcinoma and has been receiving radiation therapy for this.  Patient currently taking a low-dose statin.  Did not tolerate higher doses.  Is halfway through his second 90-day supply of his current dose.  Back pain-patient developed back pain after starting radiation therapy.  Had been to a chiropractor off [address] for thoracic back pain in the past and this helped.  Wants a referral for another chiropractic treatment.   Pain does not radiate.   Patient is having urinary and bowel incontinence since starting radiation therapy.  Has had some episodes of urinating and having bowel movements without realizing it.  Had bowel movement in the shower without realizing he needed to use the bathroom.  Patient was previously taking Zepbound and lost 50 pounds.  Has not taken it in several months and has kept the weight off, also losing an additional few pounds.  Does not need his sleep apnea machine anymore after the weight loss but still uses it for "comfort".  Patient complaining of dysuria in addition to incontinence since starting radiation therapy.  Patient states he had a urinalysis done earlier today at his oncologist.  Currently taking over-the-counter Azo.  PMH: Prostate adenocarcinoma, hyperlipidemia, obstructive sleep apnea  PSH: R foot fracture, appendectomy.    FH: mother - breast cancer, uncle - prostate; several members with heart attack.  Father, uncle, gf, gm.    Tobacco use: former - 4-5  years . Quit 5 years ago.   Alcohol use: every day, beer, 2-3/ day.  Longest time without alcohol use was 8 days and this was 3 weeks ago.  No withdrawals at that time.  Drug use: none Marital status: married - 45 years.  2 adult children.   Employment: accountant  Screenings:  Colon Cancer: 3 years ago.  5 year repeat recommended     Outpatient Encounter Medications as of 11/06/2022  Medication Sig   Incontinence Supply Disposable (INCONTINENCE BRIEF LARGE) MISC Use as needed for urinary and bowel incontinence   pravastatin (PRAVACHOL) 10 MG tablet Take by mouth.   Facility-Administered Encounter Medications as of 11/06/2022  Medication   sodium phosphate (FLEET) 7-19 GM/118ML enema 1 enema    Past Medical History:  Diagnosis Date   Cancer (HCC)    basal and squamous skin cancer   History of kidney stones    Hypertension    Pneumonia    Sleep apnea    cpap sometimes   Sleep disturbance 03/20/2013    Past Surgical History:  Procedure Laterality Date   APPENDECTOMY     FRACTURE SURGERY     right foot crushed   GOLD SEED IMPLANT N/A 10/03/2022   Procedure: GOLD SEED IMPLANT;  Surgeon: Crist Fat, MD;  Location: [facility];  Service: Urology;  Laterality: N/A;   PROSTATE BIOPSY N/A 03/28/2022   Procedure: BIOPSY TRANSRECTAL ULTRASONIC PROSTATE (TUBP);  Surgeon: Bjorn Pippin, MD;  Location: [facility];  Service: Urology;  Laterality:  N/A;  45 MINS FOR CASE   SPACE OAR INSTILLATION N/A 10/03/2022   Procedure: SPACE OAR INSTILLATION;  Surgeon: Crist Fat, MD;  Location: [facility];  Service: Urology;  Laterality: N/A;  30 MINS FOR CASE    Family History  Problem Relation Age of Onset   COPD Mother    Heart disease Father     Social History   Socioeconomic History   Marital status: Married    Spouse name: Not on file   Number of children: Not on file   Years of education: Not on file   Highest education level: Not on file   Occupational History   Not on file  Tobacco Use   Smoking status: Former    Types: Cigarettes   Smokeless tobacco: Never   Tobacco comments:    Quit 20 years ago not an everyday smoker even when smoked  Vaping Use   Vaping status: Never Used  Substance and Sexual Activity   Alcohol use: Yes    Comment: 10 drinks (beer, wine, liquor) per week   Drug use: No   Sexual activity: Not Currently  Other Topics Concern   Not on file  Social History Narrative   Not on file   Social Determinants of Health   Financial Resource Strain: Low Risk  (07/11/2022)   Received from [facility], [facility]   Overall Financial Resource Strain (CARDIA)    Difficulty of Paying Living Expenses: Not hard at all  Food Insecurity: No Food Insecurity (08/30/2022)   Hunger Vital Sign    Worried About Running Out of Food in the Last Year: Never true    Ran Out of Food in the Last Year: Never true  Transportation Needs: No Transportation Needs (08/30/2022)   PRAPARE - Administrator, Civil Service (Medical): No    Lack of Transportation (Non-Medical): No  Physical Activity: Insufficiently Active (07/11/2022)   Received from [facility], [facility]   Exercise Vital Sign    Days of Exercise per Week: 1 day    Minutes of Exercise per Session: 10 min  Stress: No Stress Concern Present (07/11/2022)   Received from [facility], [facility] of Occupational Health - Occupational Stress Questionnaire    Feeling of Stress : Only a little  Social Connections: Socially Integrated (07/11/2022)   Received from [facility], [facility]   Social Network    How would you rate your social network (family, work, friends)?: Good participation with social networks  Intimate Partner Violence: Not At Risk (08/30/2022)   Humiliation, Afraid, Rape, and Kick questionnaire    Fear of Current or Ex-Partner: No    Emotionally Abused: No    Physically Abused: No    Sexually Abused: No     ROS      Objective    BP 132/85   Pulse 62   Ht 5\' 11"  (1.803 m)   Wt 232 lb 12.8 oz (105.6 kg)   SpO2 98%   BMI 32.47 kg/m   Physical Exam General: Alert, oriented HEENT: PERRLA, CV: Regular rate rhythm, no murmurs Pulmonary: Clear bilaterally GI: Soft, normal bowel sounds MSK: Strength equal bilaterally Neuro: Cranials 2 through 12 grossly intact Extremities: No pedal edema Skin: Warm and dry. Psych: Pleasant affect     Assessment & Plan:   Acute midline thoracic back pain -     Ambulatory referral to Chiropractic  Mixed hyperlipidemia Assessment & Plan: On low-dose of  statin.  Intolerant of higher doses.  Will recheck cholesterol level today.  Orders: -     Lipid panel -     Hemoglobin A1c  Urinary incontinence without sensory awareness Assessment & Plan: Will put an order for incontinence supplies.  Related to his radiation therapy.  Patient currently states he is discussing initiating Flomax with his oncologist.  Had a urinalysis today at his oncology appointment.  Orders: -     Incontinence supply  Chronic bilateral thoracic back pain Assessment & Plan: Midthoracic pain.  Nonradiating.  Worse with straining to urinate.  Has had relief of similar symptoms in the past with chiropractor. Referral to chiropractor replaced, although advised patient this is often not necessary.   Essential hypertension Assessment & Plan: Diet controlled since weight loss.  Continue to monitor   Primary prostate adenocarcinoma [facility]) Assessment & Plan: Patient receiving radiation therapy t at [facility] currently.  Follows up with oncology regularly.   Obstructive sleep apnea syndrome Assessment & Plan: Improved with weight loss.  Continues to wear CPAP although states he does not "need it" anymore.   Other orders -     Incontinence Brief Large; Use as needed for urinary and bowel incontinence  Dispense: 30 each; Refill: 1    Return in about 4 weeks  (around 12/04/2022) for hld.   Sandre Kitty, MD

## 2022-11-06 NOTE — Telephone Encounter (Signed)
Mark Li called in to ask about medication prescription to treat UTI and wanted to know if it had be called in by doctor.  RN checked patient medications and informed him that medication was called in to [facility] at [facility].  Mark Li said he would call to find out.  No further questions or concerns at this time.

## 2022-11-06 NOTE — Progress Notes (Addendum)
Patient came over to nursing with complaints of urinary frequency, urgency, dysuria, and nocturia x15.  Denies hematuria, fever, and/or chills.  Patient last seen in clinic on 11/03/2022 for undertreat appointment for prostate cancer did discuss with Dr. Kathrynn Running about symptoms and possibly started Tamsulosin if needed.  RN took a urine specimen from patient to be tested for possible UTI, awaiting results.  Joyice Faster, PA-C and Dr. Kathrynn Running made aware.  Currently waiting results from urinalysis.  Vitals:97.7-63-20-140/76 O2 sat 98 % room air.

## 2022-11-07 ENCOUNTER — Ambulatory Visit
Admission: RE | Admit: 2022-11-07 | Discharge: 2022-11-07 | Disposition: A | Discharge status: Home / Self Care | Payer: Medicare Other | Source: Ambulatory Visit | Attending: Radiation Oncology

## 2022-11-07 ENCOUNTER — Other Ambulatory Visit: Payer: Self-pay

## 2022-11-07 ENCOUNTER — Telehealth: Payer: Self-pay

## 2022-11-07 DIAGNOSIS — Z510 Encounter for antineoplastic radiation therapy: Secondary | ICD-10-CM | POA: Diagnosis not present

## 2022-11-07 LAB — URINE CULTURE: Culture: NO GROWTH

## 2022-11-07 LAB — RAD ONC ARIA SESSION SUMMARY
Course Elapsed Days: 15
Plan Fractions Treated to Date: 11
Plan Prescribed Dose Per Fraction: 2.5 Gy
Plan Total Fractions Prescribed: 28
Plan Total Prescribed Dose: 70 Gy
Reference Point Dosage Given to Date: 27.5 Gy
Reference Point Session Dosage Given: 2.5 Gy
Session Number: 11

## 2022-11-07 LAB — HEMOGLOBIN A1C
Est. average glucose Bld gHb Est-mCnc: 103 mg/dL
Hgb A1c MFr Bld: 5.2 % (ref 4.8–5.6)

## 2022-11-07 LAB — LIPID PANEL
Chol/HDL Ratio: 4.3 ratio (ref 0.0–5.0)
Cholesterol, Total: 223 mg/dL — ABNORMAL HIGH (ref 100–199)
HDL: 52 mg/dL (ref >39)
LDL Chol Calc (NIH): 134 mg/dL — ABNORMAL HIGH (ref 0–99)
Triglycerides: 206 mg/dL — ABNORMAL HIGH (ref 0–149)
VLDL Cholesterol Cal: 37 mg/dL (ref 5–40)

## 2022-11-07 NOTE — Telephone Encounter (Signed)
RN called patient to inform of the urine culture results per Dr. Kathrynn Running looks like he has mild UTI and for him to continue taking the Cipro medication as prescribed and will follow up with undertreat visit tomorrow 11/08/2022.  Patient reports " I feel like a human again".  Medication is effective and patient offered no further complaints at this time.

## 2022-11-08 ENCOUNTER — Ambulatory Visit
Admission: RE | Admit: 2022-11-08 | Discharge: 2022-11-08 | Disposition: A | Discharge status: Home / Self Care | Payer: Medicare Other | Source: Ambulatory Visit | Attending: Radiation Oncology | Admitting: Radiation Oncology

## 2022-11-08 ENCOUNTER — Other Ambulatory Visit: Payer: Self-pay | Admitting: Radiation Oncology

## 2022-11-08 ENCOUNTER — Other Ambulatory Visit: Payer: Self-pay

## 2022-11-08 ENCOUNTER — Ambulatory Visit
Admission: RE | Admit: 2022-11-08 | Discharge: 2022-11-08 | Disposition: A | Discharge status: Home / Self Care | Payer: Medicare Other | Source: Ambulatory Visit | Attending: Radiation Oncology

## 2022-11-08 DIAGNOSIS — Z510 Encounter for antineoplastic radiation therapy: Secondary | ICD-10-CM | POA: Diagnosis not present

## 2022-11-08 LAB — RAD ONC ARIA SESSION SUMMARY
Course Elapsed Days: 16
Plan Fractions Treated to Date: 12
Plan Prescribed Dose Per Fraction: 2.5 Gy
Plan Total Fractions Prescribed: 28
Plan Total Prescribed Dose: 70 Gy
Reference Point Dosage Given to Date: 30 Gy
Reference Point Session Dosage Given: 2.5 Gy
Session Number: 12

## 2022-11-08 MED ORDER — TAMSULOSIN HCL 0.4 MG PO CAPS: 0.4000 mg | ORAL_CAPSULE | Freq: Every day | ORAL | 5 refills | Status: DC

## 2022-11-09 ENCOUNTER — Other Ambulatory Visit: Payer: Self-pay

## 2022-11-09 ENCOUNTER — Ambulatory Visit
Admission: RE | Admit: 2022-11-09 | Discharge: 2022-11-09 | Disposition: A | Discharge status: Home / Self Care | Payer: Medicare Other | Source: Ambulatory Visit | Attending: Radiation Oncology | Admitting: Radiation Oncology

## 2022-11-09 DIAGNOSIS — Z510 Encounter for antineoplastic radiation therapy: Secondary | ICD-10-CM | POA: Diagnosis not present

## 2022-11-09 LAB — RAD ONC ARIA SESSION SUMMARY
Course Elapsed Days: 17
Plan Fractions Treated to Date: 13
Plan Prescribed Dose Per Fraction: 2.5 Gy
Plan Total Fractions Prescribed: 28
Plan Total Prescribed Dose: 70 Gy
Reference Point Dosage Given to Date: 32.5 Gy
Reference Point Session Dosage Given: 2.5 Gy
Session Number: 13

## 2022-11-10 ENCOUNTER — Other Ambulatory Visit: Payer: Self-pay

## 2022-11-10 ENCOUNTER — Ambulatory Visit
Admission: RE | Admit: 2022-11-10 | Discharge: 2022-11-10 | Disposition: A | Discharge status: Home / Self Care | Payer: Medicare Other | Source: Ambulatory Visit | Attending: Radiation Oncology

## 2022-11-10 DIAGNOSIS — Z510 Encounter for antineoplastic radiation therapy: Secondary | ICD-10-CM | POA: Diagnosis not present

## 2022-11-10 LAB — RAD ONC ARIA SESSION SUMMARY
Course Elapsed Days: 18
Plan Fractions Treated to Date: 14
Plan Prescribed Dose Per Fraction: 2.5 Gy
Plan Total Fractions Prescribed: 28
Plan Total Prescribed Dose: 70 Gy
Reference Point Dosage Given to Date: 35 Gy
Reference Point Session Dosage Given: 2.5 Gy
Session Number: 14

## 2022-11-12 ENCOUNTER — Ambulatory Visit: Payer: Medicare Other

## 2022-11-13 ENCOUNTER — Ambulatory Visit
Admission: RE | Admit: 2022-11-13 | Discharge: 2022-11-13 | Disposition: A | Discharge status: Home / Self Care | Payer: Medicare Other | Source: Ambulatory Visit | Attending: Radiation Oncology | Admitting: Radiation Oncology

## 2022-11-13 ENCOUNTER — Other Ambulatory Visit: Payer: Self-pay

## 2022-11-13 DIAGNOSIS — Z510 Encounter for antineoplastic radiation therapy: Secondary | ICD-10-CM | POA: Diagnosis not present

## 2022-11-13 LAB — RAD ONC ARIA SESSION SUMMARY
Course Elapsed Days: 21
Plan Fractions Treated to Date: 15
Plan Prescribed Dose Per Fraction: 2.5 Gy
Plan Total Fractions Prescribed: 28
Plan Total Prescribed Dose: 70 Gy
Reference Point Dosage Given to Date: 37.5 Gy
Reference Point Session Dosage Given: 2.5 Gy
Session Number: 15

## 2022-11-14 ENCOUNTER — Other Ambulatory Visit: Payer: Self-pay | Admitting: Urology

## 2022-11-14 ENCOUNTER — Other Ambulatory Visit: Payer: Self-pay | Admitting: Radiation Oncology

## 2022-11-14 ENCOUNTER — Ambulatory Visit
Admission: RE | Admit: 2022-11-14 | Discharge: 2022-11-14 | Disposition: A | Discharge status: Home / Self Care | Payer: Medicare Other | Source: Ambulatory Visit | Attending: Radiation Oncology | Admitting: Radiation Oncology

## 2022-11-14 ENCOUNTER — Ambulatory Visit: Payer: Medicare Other

## 2022-11-14 VITALS — BP 143/77 | HR 64 | Temp 98.2°F | Resp 18

## 2022-11-14 DIAGNOSIS — C61 Malignant neoplasm of prostate: Secondary | ICD-10-CM

## 2022-11-14 DIAGNOSIS — Z510 Encounter for antineoplastic radiation therapy: Secondary | ICD-10-CM | POA: Diagnosis not present

## 2022-11-14 LAB — URINALYSIS, COMPLETE (UACMP) WITH MICROSCOPIC
Bacteria, UA: NONE SEEN
Bilirubin Urine: NEGATIVE
Glucose, UA: NEGATIVE mg/dL
Hgb urine dipstick: NEGATIVE
Ketones, ur: NEGATIVE mg/dL
Leukocytes,Ua: NEGATIVE
Nitrite: POSITIVE — AB
Protein, ur: 100 mg/dL — AB
Specific Gravity, Urine: 1.018 (ref 1.005–1.030)
pH: 5 (ref 5.0–8.0)

## 2022-11-14 MED ORDER — CELECOXIB 200 MG PO CAPS: 200.0000 mg | ORAL_CAPSULE | Freq: Two times a day (BID) | ORAL | 0 refills | Status: DC

## 2022-11-14 MED ORDER — HYDROCODONE-ACETAMINOPHEN 5-325 MG PO TABS: 1.0000 | ORAL_TABLET | Freq: Four times a day (QID) | ORAL | 0 refills | Status: DC | PRN

## 2022-11-14 MED ORDER — DOXYCYCLINE HYCLATE 100 MG PO TABS: 100.0000 mg | ORAL_TABLET | Freq: Two times a day (BID) | ORAL | 0 refills | Status: DC

## 2022-11-14 MED ORDER — PHENAZOPYRIDINE HCL 200 MG PO TABS: 200.0000 mg | ORAL_TABLET | Freq: Three times a day (TID) | ORAL | 0 refills | Status: DC | PRN

## 2022-11-14 NOTE — Progress Notes (Signed)
Pt arrived to treatment but in 8/10 groin pain. Pt reports he has been having pain with standing, sitting, and urination. Pt  VS taken and stable.  Rn Monica reaching out to provider for assistance with this pt need.

## 2022-11-14 NOTE — Progress Notes (Signed)
Per Dr. Kathrynn Running: plan to hold radiation for the remainder of the week and reassess on Monday 9/23 if treatment can resume. Ashlyn Bruning, PA-C also saw patient and sent prescriptions for new antibiotic, pain medication, and urinary analgesic to patient's preferred pharmacy. Patient able to provide urine sample for testing and will be called with results

## 2022-11-15 ENCOUNTER — Telehealth: Payer: Self-pay

## 2022-11-15 ENCOUNTER — Ambulatory Visit: Payer: Medicare Other

## 2022-11-15 NOTE — Telephone Encounter (Signed)
Per Marcello Fennel, PA-C RN called patient to give him results of urine culture.  The urine culture was negative for bacterial growth but we suspect this may be bacterial prostatitis which would not necessarily show in urine. Ashlyn did informed Dr. Annabell Howells and he is in agreement with the current plan to complete 3 weeks of Doxycycline along with symptomatic treatment with sitz baths, anti-inflammatory (Celebrex), pyridium prn, and pain medication as needed.  Patient expressed appreciation for the team taking good care of him in his time of need.  Verbalized understanding od treatment plan at this time.  We will see him back for radiation treatment on Monday 11/20/2022.

## 2022-11-16 ENCOUNTER — Ambulatory Visit: Payer: Medicare Other

## 2022-11-17 ENCOUNTER — Ambulatory Visit: Payer: Medicare Other

## 2022-11-18 ENCOUNTER — Ambulatory Visit: Payer: Medicare Other

## 2022-11-20 ENCOUNTER — Other Ambulatory Visit: Payer: Self-pay

## 2022-11-20 ENCOUNTER — Ambulatory Visit
Admission: RE | Admit: 2022-11-20 | Discharge: 2022-11-20 | Disposition: A | Discharge status: Home / Self Care | Payer: Medicare Other | Source: Ambulatory Visit | Attending: Radiation Oncology | Admitting: Radiation Oncology

## 2022-11-20 ENCOUNTER — Ambulatory Visit
Admission: RE | Admit: 2022-11-20 | Discharge: 2022-11-20 | Disposition: A | Discharge status: Home / Self Care | Payer: Medicare Other | Source: Ambulatory Visit | Attending: Radiation Oncology

## 2022-11-20 DIAGNOSIS — Z510 Encounter for antineoplastic radiation therapy: Secondary | ICD-10-CM | POA: Diagnosis not present

## 2022-11-20 LAB — RAD ONC ARIA SESSION SUMMARY
Course Elapsed Days: 28
Plan Fractions Treated to Date: 16
Plan Prescribed Dose Per Fraction: 2.5 Gy
Plan Total Fractions Prescribed: 28
Plan Total Prescribed Dose: 70 Gy
Reference Point Dosage Given to Date: 40 Gy
Reference Point Session Dosage Given: 2.5 Gy
Session Number: 16

## 2022-11-21 ENCOUNTER — Other Ambulatory Visit: Payer: Self-pay

## 2022-11-21 ENCOUNTER — Ambulatory Visit
Admission: RE | Admit: 2022-11-21 | Discharge: 2022-11-21 | Disposition: A | Discharge status: Home / Self Care | Payer: Medicare Other | Source: Ambulatory Visit | Attending: Radiation Oncology | Admitting: Radiation Oncology

## 2022-11-21 DIAGNOSIS — Z510 Encounter for antineoplastic radiation therapy: Secondary | ICD-10-CM | POA: Diagnosis not present

## 2022-11-21 LAB — RAD ONC ARIA SESSION SUMMARY
Course Elapsed Days: 29
Plan Fractions Treated to Date: 17
Plan Prescribed Dose Per Fraction: 2.5 Gy
Plan Total Fractions Prescribed: 28
Plan Total Prescribed Dose: 70 Gy
Reference Point Dosage Given to Date: 42.5 Gy
Reference Point Session Dosage Given: 2.5 Gy
Session Number: 17

## 2022-11-22 ENCOUNTER — Ambulatory Visit
Admission: RE | Admit: 2022-11-22 | Discharge: 2022-11-22 | Disposition: A | Discharge status: Home / Self Care | Payer: Medicare Other | Source: Ambulatory Visit | Attending: Radiation Oncology

## 2022-11-22 ENCOUNTER — Other Ambulatory Visit: Payer: Self-pay

## 2022-11-22 DIAGNOSIS — Z510 Encounter for antineoplastic radiation therapy: Secondary | ICD-10-CM | POA: Diagnosis not present

## 2022-11-22 LAB — RAD ONC ARIA SESSION SUMMARY
Course Elapsed Days: 30
Plan Fractions Treated to Date: 18
Plan Prescribed Dose Per Fraction: 2.5 Gy
Plan Total Fractions Prescribed: 28
Plan Total Prescribed Dose: 70 Gy
Reference Point Dosage Given to Date: 45 Gy
Reference Point Session Dosage Given: 2.5 Gy
Session Number: 18

## 2022-11-23 ENCOUNTER — Other Ambulatory Visit: Payer: Self-pay

## 2022-11-23 ENCOUNTER — Ambulatory Visit
Admission: RE | Admit: 2022-11-23 | Discharge: 2022-11-23 | Disposition: A | Discharge status: Home / Self Care | Payer: Medicare Other | Source: Ambulatory Visit | Attending: Radiation Oncology | Admitting: Radiation Oncology

## 2022-11-23 DIAGNOSIS — Z510 Encounter for antineoplastic radiation therapy: Secondary | ICD-10-CM | POA: Diagnosis not present

## 2022-11-23 LAB — RAD ONC ARIA SESSION SUMMARY
Course Elapsed Days: 31
Plan Fractions Treated to Date: 19
Plan Prescribed Dose Per Fraction: 2.5 Gy
Plan Total Fractions Prescribed: 28
Plan Total Prescribed Dose: 70 Gy
Reference Point Dosage Given to Date: 47.5 Gy
Reference Point Session Dosage Given: 2.5 Gy
Session Number: 19

## 2022-11-24 ENCOUNTER — Other Ambulatory Visit: Payer: Self-pay

## 2022-11-24 ENCOUNTER — Ambulatory Visit
Admission: RE | Admit: 2022-11-24 | Discharge: 2022-11-24 | Disposition: A | Discharge status: Home / Self Care | Payer: Medicare Other | Source: Ambulatory Visit | Attending: Radiation Oncology | Admitting: Radiation Oncology

## 2022-11-24 DIAGNOSIS — Z510 Encounter for antineoplastic radiation therapy: Secondary | ICD-10-CM | POA: Diagnosis not present

## 2022-11-24 LAB — RAD ONC ARIA SESSION SUMMARY
Course Elapsed Days: 32
Plan Fractions Treated to Date: 20
Plan Prescribed Dose Per Fraction: 2.5 Gy
Plan Total Fractions Prescribed: 28
Plan Total Prescribed Dose: 70 Gy
Reference Point Dosage Given to Date: 50 Gy
Reference Point Session Dosage Given: 2.5 Gy
Session Number: 20

## 2022-11-27 ENCOUNTER — Other Ambulatory Visit: Payer: Self-pay

## 2022-11-27 ENCOUNTER — Ambulatory Visit
Admission: RE | Admit: 2022-11-27 | Discharge: 2022-11-27 | Disposition: A | Discharge status: Home / Self Care | Payer: Medicare Other | Source: Ambulatory Visit | Attending: Radiation Oncology | Admitting: Radiation Oncology

## 2022-11-27 DIAGNOSIS — Z510 Encounter for antineoplastic radiation therapy: Secondary | ICD-10-CM | POA: Diagnosis not present

## 2022-11-27 LAB — RAD ONC ARIA SESSION SUMMARY
Course Elapsed Days: 35
Plan Fractions Treated to Date: 21
Plan Prescribed Dose Per Fraction: 2.5 Gy
Plan Total Fractions Prescribed: 28
Plan Total Prescribed Dose: 70 Gy
Reference Point Dosage Given to Date: 52.5 Gy
Reference Point Session Dosage Given: 2.5 Gy
Session Number: 21

## 2022-11-28 ENCOUNTER — Ambulatory Visit
Admission: RE | Admit: 2022-11-28 | Discharge: 2022-11-28 | Disposition: A | Discharge status: Home / Self Care | Payer: Medicare Other | Source: Ambulatory Visit | Attending: Radiation Oncology | Admitting: Radiation Oncology

## 2022-11-28 ENCOUNTER — Other Ambulatory Visit: Payer: Self-pay

## 2022-11-28 DIAGNOSIS — Z510 Encounter for antineoplastic radiation therapy: Secondary | ICD-10-CM | POA: Insufficient documentation

## 2022-11-28 DIAGNOSIS — Z79899 Other long term (current) drug therapy: Secondary | ICD-10-CM | POA: Insufficient documentation

## 2022-11-28 DIAGNOSIS — C61 Malignant neoplasm of prostate: Secondary | ICD-10-CM | POA: Diagnosis present

## 2022-11-28 LAB — RAD ONC ARIA SESSION SUMMARY
Course Elapsed Days: 36
Plan Fractions Treated to Date: 22
Plan Prescribed Dose Per Fraction: 2.5 Gy
Plan Total Fractions Prescribed: 28
Plan Total Prescribed Dose: 70 Gy
Reference Point Dosage Given to Date: 55 Gy
Reference Point Session Dosage Given: 2.5 Gy
Session Number: 22

## 2022-11-29 ENCOUNTER — Other Ambulatory Visit: Payer: Self-pay

## 2022-11-29 ENCOUNTER — Ambulatory Visit
Admission: RE | Admit: 2022-11-29 | Discharge: 2022-11-29 | Disposition: A | Discharge status: Home / Self Care | Payer: Medicare Other | Source: Ambulatory Visit | Attending: Radiation Oncology | Admitting: Radiation Oncology

## 2022-11-29 DIAGNOSIS — Z510 Encounter for antineoplastic radiation therapy: Secondary | ICD-10-CM | POA: Diagnosis not present

## 2022-11-29 LAB — RAD ONC ARIA SESSION SUMMARY
Course Elapsed Days: 37
Plan Fractions Treated to Date: 23
Plan Prescribed Dose Per Fraction: 2.5 Gy
Plan Total Fractions Prescribed: 28
Plan Total Prescribed Dose: 70 Gy
Reference Point Dosage Given to Date: 57.5 Gy
Reference Point Session Dosage Given: 2.5 Gy
Session Number: 23

## 2022-11-30 ENCOUNTER — Ambulatory Visit
Admission: RE | Admit: 2022-11-30 | Discharge: 2022-11-30 | Disposition: A | Discharge status: Home / Self Care | Payer: Medicare Other | Source: Ambulatory Visit | Attending: Radiation Oncology | Admitting: Radiation Oncology

## 2022-11-30 ENCOUNTER — Ambulatory Visit: Payer: Medicare Other

## 2022-11-30 ENCOUNTER — Other Ambulatory Visit: Payer: Self-pay

## 2022-11-30 DIAGNOSIS — Z510 Encounter for antineoplastic radiation therapy: Secondary | ICD-10-CM | POA: Diagnosis not present

## 2022-11-30 LAB — RAD ONC ARIA SESSION SUMMARY
Course Elapsed Days: 38
Plan Fractions Treated to Date: 24
Plan Prescribed Dose Per Fraction: 2.5 Gy
Plan Total Fractions Prescribed: 28
Plan Total Prescribed Dose: 70 Gy
Reference Point Dosage Given to Date: 60 Gy
Reference Point Session Dosage Given: 2.5 Gy
Session Number: 24

## 2022-12-01 ENCOUNTER — Ambulatory Visit
Admission: RE | Admit: 2022-12-01 | Discharge: 2022-12-01 | Disposition: A | Discharge status: Home / Self Care | Payer: Medicare Other | Source: Ambulatory Visit | Attending: Radiation Oncology

## 2022-12-01 ENCOUNTER — Ambulatory Visit: Payer: Medicare Other

## 2022-12-01 ENCOUNTER — Other Ambulatory Visit: Payer: Self-pay

## 2022-12-01 DIAGNOSIS — Z510 Encounter for antineoplastic radiation therapy: Secondary | ICD-10-CM | POA: Diagnosis not present

## 2022-12-01 LAB — RAD ONC ARIA SESSION SUMMARY
Course Elapsed Days: 39
Plan Fractions Treated to Date: 25
Plan Prescribed Dose Per Fraction: 2.5 Gy
Plan Total Fractions Prescribed: 28
Plan Total Prescribed Dose: 70 Gy
Reference Point Dosage Given to Date: 62.5 Gy
Reference Point Session Dosage Given: 2.5 Gy
Session Number: 25

## 2022-12-04 ENCOUNTER — Other Ambulatory Visit: Payer: Self-pay

## 2022-12-04 ENCOUNTER — Encounter: Payer: Self-pay | Admitting: Family Medicine

## 2022-12-04 ENCOUNTER — Ambulatory Visit
Admission: RE | Admit: 2022-12-04 | Discharge: 2022-12-04 | Disposition: A | Discharge status: Home / Self Care | Payer: Medicare Other | Source: Ambulatory Visit | Attending: Radiation Oncology

## 2022-12-04 ENCOUNTER — Ambulatory Visit (INDEPENDENT_AMBULATORY_CARE_PROVIDER_SITE_OTHER): Payer: Medicare Other | Admitting: Family Medicine

## 2022-12-04 VITALS — BP 122/82 | HR 61 | Ht 71.0 in | Wt 236.8 lb

## 2022-12-04 DIAGNOSIS — R197 Diarrhea, unspecified: Secondary | ICD-10-CM | POA: Insufficient documentation

## 2022-12-04 DIAGNOSIS — N410 Acute prostatitis: Secondary | ICD-10-CM | POA: Insufficient documentation

## 2022-12-04 DIAGNOSIS — E782 Mixed hyperlipidemia: Secondary | ICD-10-CM

## 2022-12-04 DIAGNOSIS — Z510 Encounter for antineoplastic radiation therapy: Secondary | ICD-10-CM | POA: Diagnosis not present

## 2022-12-04 DIAGNOSIS — R339 Retention of urine, unspecified: Secondary | ICD-10-CM | POA: Insufficient documentation

## 2022-12-04 LAB — RAD ONC ARIA SESSION SUMMARY
Course Elapsed Days: 42
Plan Fractions Treated to Date: 26
Plan Prescribed Dose Per Fraction: 2.5 Gy
Plan Total Fractions Prescribed: 28
Plan Total Prescribed Dose: 70 Gy
Reference Point Dosage Given to Date: 65 Gy
Reference Point Session Dosage Given: 2.5 Gy
Session Number: 26

## 2022-12-04 MED ORDER — TAMSULOSIN HCL 0.4 MG PO CAPS: 0.4000 mg | ORAL_CAPSULE | Freq: Every day | ORAL | 3 refills | Status: DC

## 2022-12-04 NOTE — Assessment & Plan Note (Signed)
Has prostate cancer currently receiving radiation therapy.  Continue Flomax.

## 2022-12-04 NOTE — Patient Instructions (Addendum)
It was nice to see you today,  We addressed the following topics today: -I will send in a refill of your Flomax.  You can take this daily indefinitely.  It is to help urinary symptoms related to your prostate - I would hold off on taking your antibiotic until you talk to your oncologist tomorrow. - I will recheck your cholesterol today and if it is still high we can send in Zetia and then recheck it again in 2 months. -If you continue to have 4 episodes of more of diarrhea every day for the next 3 days let us know and we can test you for C. difficile colitis.  Continue to drink plenty of fluids if you are having diarrhea to avoid dehydration.  Have a great day,  Frederic Jericho, MD

## 2022-12-04 NOTE — Assessment & Plan Note (Addendum)
1 day of greater than 5 episodes of diarrhea.  Occurred after patient indulged in food and drink at a high school reunion over the weekend.  Recently taking doxycycline for prostatitis.  Took at least 2 weeks of this medication, stopped 1 week ago.  Advised patient that if diarrhea continues daily for 4 more days for 4 or more episodes a day to inform us so we can test him for C. difficile.  Doxycycline generally less likely to cause C. difficile colitis, but not impossible.

## 2022-12-04 NOTE — Assessment & Plan Note (Signed)
Patient taking pravastatin.  Intolerant of higher doses or high intensity statin.  Discussed repeating lipid panel today and if still elevated above goal we will add Zetia and recheck in 2 months.

## 2022-12-04 NOTE — Assessment & Plan Note (Addendum)
Patient was prescribed 3 weeks of doxycycline for presumed bacterial prostatitis.  Was having some dysuria symptoms, urine culture was negative.  He was prescribed antibiotics by his oncologist.  Patient stopped taking it after approximately 14 days, has not been off of it for at least 5 days.  Has approximately 5 days left on his current prescription.  Since patient has finished most of the course and only has 5 days left, recommended he continue to hold off on taking the antibiotic until he talks with his oncologist who prescribed it tomorrow.   Optimal treatment durations for prostatitis vary anywhere from 2 to 4 weeks.

## 2022-12-04 NOTE — Progress Notes (Signed)
Established Patient Office Visit  Subjective   Patient ID: Mark Li, male    DOB: [birth year]  Age: 67 y.o. MRN: [id number]  Chief Complaint  Patient presents with   Medical Management of Chronic Issues    HPI  Prostate cancer -patient has 2 more radiation appointments.  He sees his oncologist tomorrow.  He had a brief interruption in therapy to treat a prostate infection.  He was prescribed 21 days of doxycycline and took it for approximately 2 weeks before stopping due to diarrhea.  He has about 5 days left on this medication but has not taken it in over a week.  Patient complains that when he has diarrhea he has worsening pain with urination.  When he does not have diarrhea he has normal bowel movements and no pain with urination.  He recently had a high school reunion and ate more food and drink more alcohol than he typically would and now over the last 24 hours he has had 5 or more episodes of diarrhea during the night.  We discussed patient's Flomax and how he is able to take that indefinitely.  Patient has been taking Celebrex and Azo over-the-counter.  This is for his urinary complaints that sometimes keep him up at night.  We discussed patient's cholesterol levels and taking Zetia if necessary.  Will recheck his cholesterol today and prescribe Zetia if necessary.  And then follow-up in 2 months to recheck cholesterol.   The 10-year ASCVD risk score (Arnett DK, et al., 2019) is: 14.2%  Health Maintenance Due  Topic Date Due   Hepatitis C Screening  Never done   Colonoscopy  10/23/2016   Zoster Vaccines- Shingrix (2 of 2) 03/02/2020      Objective:     BP 122/82   Pulse 61   Ht 5\' 11"  (1.803 m)   Wt 236 lb 12.8 oz (107.4 kg)   SpO2 94%   BMI 33.03 kg/m    Physical Exam General: Alert, oriented Pulmonary: No respiratory stress Psych: Pleasant affect   Results for orders placed or performed in visit on 12/04/22  Rad Onc [facility] Session Summary  Result  Value Ref Range   Course ID [id number]    Course Start Date 10/06/2022    Session Number 26    Course First Treatment Date 10/23/2022  3:46 PM    Course Last Treatment Date 12/04/2022  9:25 AM    Course Elapsed Days 42    Reference Point ID Prostate DP    Reference Point Dosage Given to Date 65 Gy   Reference Point Session Dosage Given 2.5 Gy   Plan ID Prostate    Plan Name Prostate    Plan Fractions Treated to Date 26    Plan Total Fractions Prescribed 28    Plan Prescribed Dose Per Fraction 2.5 Gy   Plan Total Prescribed Dose 70.000000 Gy   Plan Primary Reference Point Prostate DP         Assessment & Plan:   Mixed hyperlipidemia Assessment & Plan: Patient taking pravastatin.  Intolerant of higher doses or high intensity statin.  Discussed repeating lipid panel today and if still elevated above goal we will add Zetia and recheck in 2 months.  Orders: -     Lipid panel  Urinary retention Assessment & Plan: Has prostate cancer currently receiving radiation therapy.  Continue Flomax.   Acute diarrhea Assessment & Plan: 1 day of greater than 5 episodes of diarrhea.  Occurred after patient indulged  in food and drink at a high school reunion over the weekend.  Recently taking doxycycline for prostatitis.  Took at least 2 weeks of this medication, stopped 1 week ago.  Advised patient that if diarrhea continues daily for 4 more days for 4 or more episodes a day to inform us so we can test him for C. difficile.  Doxycycline generally less likely to cause C. difficile colitis, but not impossible.   Acute prostatitis Assessment & Plan: Patient was prescribed 3 weeks of doxycycline for presumed bacterial prostatitis.  Was having some dysuria symptoms, urine culture was negative.  He was prescribed antibiotics by his oncologist.  Patient stopped taking it after approximately 14 days, has not been off of it for at least 5 days.  Has approximately 5 days left on his current prescription.   Since patient has finished most of the course and only has 5 days left, recommended he continue to hold off on taking the antibiotic until he talks with his oncologist who prescribed it tomorrow.   Optimal treatment durations for prostatitis vary anywhere from 2 to 4 weeks.   Other orders -     Tamsulosin HCl; Take 1 capsule (0.4 mg total) by mouth daily after supper.  Dispense: 90 capsule; Refill: 3     Return in about 2 months (around 02/03/2023) for hld, prostate.    Sandre Kitty, MD

## 2022-12-05 ENCOUNTER — Other Ambulatory Visit: Payer: Self-pay

## 2022-12-05 ENCOUNTER — Ambulatory Visit
Admission: RE | Admit: 2022-12-05 | Discharge: 2022-12-05 | Disposition: A | Discharge status: Home / Self Care | Payer: Medicare Other | Source: Ambulatory Visit | Attending: Radiation Oncology | Admitting: Radiation Oncology

## 2022-12-05 ENCOUNTER — Other Ambulatory Visit: Payer: Self-pay | Admitting: Family Medicine

## 2022-12-05 DIAGNOSIS — Z510 Encounter for antineoplastic radiation therapy: Secondary | ICD-10-CM | POA: Diagnosis not present

## 2022-12-05 LAB — LIPID PANEL
Chol/HDL Ratio: 4.7 {ratio} (ref 0.0–5.0)
Cholesterol, Total: 232 mg/dL — ABNORMAL HIGH (ref 100–199)
HDL: 49 mg/dL (ref >39)
LDL Chol Calc (NIH): 161 mg/dL — ABNORMAL HIGH (ref 0–99)
Triglycerides: 124 mg/dL (ref 0–149)
VLDL Cholesterol Cal: 22 mg/dL (ref 5–40)

## 2022-12-05 LAB — RAD ONC ARIA SESSION SUMMARY
Course Elapsed Days: 43
Plan Fractions Treated to Date: 27
Plan Prescribed Dose Per Fraction: 2.5 Gy
Plan Total Fractions Prescribed: 28
Plan Total Prescribed Dose: 70 Gy
Reference Point Dosage Given to Date: 67.5 Gy
Reference Point Session Dosage Given: 2.5 Gy
Session Number: 27

## 2022-12-05 MED ORDER — EZETIMIBE 10 MG PO TABS: 10.0000 mg | ORAL_TABLET | Freq: Every day | ORAL | 2 refills | Status: DC

## 2022-12-06 ENCOUNTER — Other Ambulatory Visit: Payer: Self-pay

## 2022-12-06 ENCOUNTER — Ambulatory Visit
Admission: RE | Admit: 2022-12-06 | Discharge: 2022-12-06 | Disposition: A | Discharge status: Home / Self Care | Payer: Medicare Other | Source: Ambulatory Visit | Attending: Radiation Oncology | Admitting: Radiation Oncology

## 2022-12-06 DIAGNOSIS — Z510 Encounter for antineoplastic radiation therapy: Secondary | ICD-10-CM | POA: Diagnosis not present

## 2022-12-06 LAB — RAD ONC ARIA SESSION SUMMARY
Course Elapsed Days: 44
Plan Fractions Treated to Date: 28
Plan Prescribed Dose Per Fraction: 2.5 Gy
Plan Total Fractions Prescribed: 28
Plan Total Prescribed Dose: 70 Gy
Reference Point Dosage Given to Date: 70 Gy
Reference Point Session Dosage Given: 2.5 Gy
Session Number: 28

## 2022-12-07 NOTE — Radiation Completion Notes (Addendum)
  Radiation Oncology         [phone] ________________________________  Name: Mark Li MRN: [id number]  Date: 12/06/2022  DOB: 1955/05/10  Referring Physician: Aretta Nip, M.D. Date of Service: 2022-12-07 Radiation Oncologist: Margaretmary Bayley, M.D. [facility]     RADIATION ONCOLOGY END OF TREATMENT NOTE     Diagnosis: 67 y.o. gentleman with Stage T2a adenocarcinoma of the prostate with Gleason score of 3+4, and PSA of 6.5.   Intent: Curative     ==========DELIVERED PLANS==========  First Treatment Date: 2022-10-23 - Last Treatment Date: 2022-12-06   Plan Name: Prostate Site: Prostate Technique: IMRT Mode: Photon Dose Per Fraction: 2.5 Gy Prescribed Dose (Delivered / Prescribed): 70 Gy / 70 Gy Prescribed Fxs (Delivered / Prescribed): 28 / 28     ==========ON TREATMENT VISIT DATES========== 2022-10-27, 2022-11-03, 2022-11-08, 2022-11-14, 2022-11-20, 2022-11-30, 2022-12-05     See weekly On Treatment Notes in Epic for details.  He tolerated the daily radiation treatments relatively well but did develop relatively severe urinary symptoms about midway through treatment.  These were managed with medications and did improve.  He also reported some modest fatigue.  The patient will receive a call in about one month from the radiation oncology department. He will continue follow up with his urologist, Dr. Annabell Howells as well.  ------------------------------------------------   Margaretmary Dys, MD [facility]  Radiation Oncology Direct Dial: [phone]  Fax: [phone] [email or web address]  Skype  LinkedIn

## 2022-12-08 ENCOUNTER — Encounter: Payer: Self-pay | Admitting: Family Medicine

## 2022-12-11 ENCOUNTER — Other Ambulatory Visit: Payer: Medicare Other

## 2022-12-11 ENCOUNTER — Other Ambulatory Visit: Payer: Self-pay | Admitting: Family Medicine

## 2022-12-11 ENCOUNTER — Telehealth: Payer: Self-pay

## 2022-12-11 DIAGNOSIS — R300 Dysuria: Secondary | ICD-10-CM

## 2022-12-11 DIAGNOSIS — R197 Diarrhea, unspecified: Secondary | ICD-10-CM

## 2022-12-11 NOTE — Progress Notes (Signed)
Patient complaining of continued dysuria and diarrhea.  Recent antibiotic use approximately 1 month ago, ciprofloxacin.  Will provide patient with stool sample container and toilet hat to collect it.  He can turn it to [employer].  Will provide urine sample in our office and we can send off for culture.

## 2022-12-11 NOTE — Telephone Encounter (Signed)
RN returned patient call had complaints of not feeling well and that he had just left PCP office.  Mr. Shahan completed final treatment on 12/06/2022.  Complaints of frequent urination q1h, dysuria, and frequent bowel movements.  He states that they are planning to get sample of both urine and stool for testing.  He also stated that he had some leftover doxycycline from previous UTI that he's taking.  Advised the patient to let his PCP know that he is taking that medication on his own.  RN will follow up with patient in couple days to see how he's doing.  Will inform Ashlyn Bruning, PA-C.

## 2022-12-12 LAB — URINALYSIS
Bilirubin, UA: NEGATIVE
Glucose, UA: NEGATIVE
Ketones, UA: NEGATIVE
Leukocytes,UA: NEGATIVE
Nitrite, UA: NEGATIVE
RBC, UA: NEGATIVE
Specific Gravity, UA: 1.021 (ref 1.005–1.030)
Urobilinogen, Ur: 0.2 mg/dL (ref 0.2–1.0)
pH, UA: 5 (ref 5.0–7.5)

## 2022-12-13 ENCOUNTER — Telehealth: Payer: Self-pay

## 2022-12-13 LAB — URINE CULTURE: Organism ID, Bacteria: NO GROWTH

## 2022-12-13 LAB — CLOSTRIDIUM DIFFICILE BY PCR: Toxigenic C. Difficile by PCR: NEGATIVE

## 2022-12-13 NOTE — Telephone Encounter (Signed)
Called the patient back, did not get an answer

## 2022-12-13 NOTE — Telephone Encounter (Signed)
Patient called office returning call, please call patient back at your earliest convenience, thanks.

## 2022-12-28 ENCOUNTER — Ambulatory Visit: Payer: Medicare Other

## 2023-01-19 ENCOUNTER — Other Ambulatory Visit: Payer: Self-pay | Admitting: Urology

## 2023-01-19 DIAGNOSIS — C61 Malignant neoplasm of prostate: Secondary | ICD-10-CM

## 2023-01-23 ENCOUNTER — Ambulatory Visit: Payer: Self-pay | Admitting: Family Medicine

## 2023-01-23 ENCOUNTER — Emergency Department (HOSPITAL_COMMUNITY)
Admission: EM | Admit: 2023-01-23 | Discharge: 2023-01-23 | Disposition: A | Discharge status: Home / Self Care | Payer: Medicare Other | Attending: Emergency Medicine | Admitting: Emergency Medicine

## 2023-01-23 ENCOUNTER — Emergency Department (HOSPITAL_COMMUNITY): Payer: Medicare Other

## 2023-01-23 ENCOUNTER — Ambulatory Visit
Admission: RE | Admit: 2023-01-23 | Discharge: 2023-01-23 | Disposition: A | Discharge status: Home / Self Care | Payer: Medicare Other | Source: Ambulatory Visit | Attending: Radiation Oncology | Admitting: Radiation Oncology

## 2023-01-23 ENCOUNTER — Other Ambulatory Visit: Payer: Self-pay

## 2023-01-23 ENCOUNTER — Ambulatory Visit
Admission: EM | Admit: 2023-01-23 | Discharge: 2023-01-23 | Disposition: A | Discharge status: Other Acute Inpatient Hospital | Payer: Medicare Other | Attending: Family Medicine | Admitting: Family Medicine

## 2023-01-23 DIAGNOSIS — C61 Malignant neoplasm of prostate: Secondary | ICD-10-CM | POA: Diagnosis not present

## 2023-01-23 DIAGNOSIS — R310 Gross hematuria: Secondary | ICD-10-CM

## 2023-01-23 DIAGNOSIS — Z8546 Personal history of malignant neoplasm of prostate: Secondary | ICD-10-CM | POA: Diagnosis not present

## 2023-01-23 DIAGNOSIS — R319 Hematuria, unspecified: Secondary | ICD-10-CM | POA: Diagnosis present

## 2023-01-23 LAB — POCT URINALYSIS DIP (MANUAL ENTRY)
Bilirubin, UA: NEGATIVE
Glucose, UA: NEGATIVE mg/dL
Ketones, POC UA: NEGATIVE mg/dL
Leukocytes, UA: NEGATIVE
Nitrite, UA: NEGATIVE
Protein Ur, POC: 30 mg/dL — AB
Spec Grav, UA: 1.025 (ref 1.010–1.025)
Urobilinogen, UA: 1 U/dL
pH, UA: 7.5 (ref 5.0–8.0)

## 2023-01-23 LAB — CBC
HCT: 47.9 % (ref 39.0–52.0)
Hemoglobin: 15.8 g/dL (ref 13.0–17.0)
MCH: 32 pg (ref 26.0–34.0)
MCHC: 33 g/dL (ref 30.0–36.0)
MCV: 97.2 fL (ref 80.0–100.0)
Platelets: 184 10*3/uL (ref 150–400)
RBC: 4.93 MIL/uL (ref 4.22–5.81)
RDW: 13.2 % (ref 11.5–15.5)
WBC: 4.1 10*3/uL (ref 4.0–10.5)
nRBC: 0 % (ref 0.0–0.2)

## 2023-01-23 LAB — URINALYSIS, ROUTINE W REFLEX MICROSCOPIC
Bacteria, UA: NONE SEEN
Bilirubin Urine: NEGATIVE
Glucose, UA: NEGATIVE mg/dL
Ketones, ur: NEGATIVE mg/dL
Leukocytes,Ua: NEGATIVE
Nitrite: NEGATIVE
Protein, ur: NEGATIVE mg/dL
RBC / HPF: >50 RBC/hpf (ref 0–5)
Specific Gravity, Urine: 1.019 (ref 1.005–1.030)
pH: 5 (ref 5.0–8.0)

## 2023-01-23 LAB — BASIC METABOLIC PANEL
Anion gap: 10 (ref 5–15)
BUN: 22 mg/dL (ref 8–23)
CO2: 23 mmol/L (ref 22–32)
Calcium: 9.1 mg/dL (ref 8.9–10.3)
Chloride: 107 mmol/L (ref 98–111)
Creatinine, Ser: 0.87 mg/dL (ref 0.61–1.24)
GFR, Estimated: >60 mL/min (ref >60)
Glucose, Bld: 86 mg/dL (ref 70–99)
Potassium: 3.9 mmol/L (ref 3.5–5.1)
Sodium: 140 mmol/L (ref 135–145)

## 2023-01-23 NOTE — ED Provider Notes (Signed)
  Physical Exam  BP (!) 152/96   Pulse (!) 56   Temp 98.3 F (36.8 C) (Oral)   Resp 18   SpO2 96%   Physical Exam Constitutional:      General: He is not in acute distress.    Appearance: Normal appearance.  HENT:     Head: Normocephalic and atraumatic.     Nose: No congestion or rhinorrhea.  Eyes:     General:        Right eye: No discharge.        Left eye: No discharge.     Extraocular Movements: Extraocular movements intact.     Pupils: Pupils are equal, round, and reactive to light.  Cardiovascular:     Rate and Rhythm: Normal rate and regular rhythm.     Heart sounds: No murmur heard. Pulmonary:     Effort: No respiratory distress.     Breath sounds: No wheezing or rales.  Abdominal:     General: There is no distension.     Tenderness: There is no abdominal tenderness.  Musculoskeletal:        General: Normal range of motion.     Cervical back: Normal range of motion.  Skin:    General: Skin is warm and dry.  Neurological:     General: No focal deficit present.     Mental Status: He is alert.     Procedures  Procedures  ED Course / MDM    Medical Decision Making Amount and/or Complexity of Data Reviewed Labs: ordered. Radiology: ordered.   Patient received in handoff.  Hematuria currently not in any pain.  Pending results of CT at time of signout.  CT stone study with a 6 mm calculus in the left posterior aspect of the urinary bladder and a possible small focus of hemorrhage seen posteriorly in the prostate gland.  Urinalysis with no evidence of infection.  Hemoglobin stable.  At this time he does not meet inpatient criteria for admission and will be discharged with outpatient urology follow-up.  I did send a message to his primary neurologist to inform him of his ER visit.  Return precautions given which she voiced understanding and patient discharged       Glendora Score, MD 01/23/23 [phone]

## 2023-01-23 NOTE — Telephone Encounter (Signed)
Called pt advised him to go to Urgent Care he was agreeable

## 2023-01-23 NOTE — Progress Notes (Signed)
  Radiation Oncology         [phone] ________________________________  Name: Mark Li MRN: [id number]  Date of Service: 01/23/2023  DOB: [birth year]  Post Treatment Telephone Note  Diagnosis:  67 y.o. gentleman with Stage T2a adenocarcinoma of the prostate with Gleason score of 3+4, and PSA of 6.5. (as documented in provider EOT note)  Pre Treatment IPSS Score: 7 (as documented in the provider consult note)  The patient was available for call today.   Symptoms of fatigue have improved since completing therapy.  Symptoms of bladder changes have improved since completing therapy but patient is currently having severe hematuria w/ blood clots and was emitted to the [facility] ER on 01/23/2023 and medications for bladder symptoms include Tamsulosin.  Symptoms of bowel changes have improved since completing therapy. Current symptoms include none, and medications for bowel symptoms include none.   Post Treatment IPSS Score: IPSS Questionnaire (AUA-7): Over the past month.   1)  How often have you had a sensation of not emptying your bladder completely after you finish urinating?  3 - About half the time  2)  How often have you had to urinate again less than two hours after you finished urinating? 3 - About half the time  3)  How often have you found you stopped and started again several times when you urinated?  3 - About half the time  4) How difficult have you found it to postpone urination?  3 - About half the time  5) How often have you had a weak urinary stream?  4 - More than half the time  6) How often have you had to push or strain to begin urination?  1 - Less than 1 time in 5  7) How many times did you most typically get up to urinate from the time you went to bed until the time you got up in the morning?  4 - 4 times  Total score:  21. Which indicates severe symptoms  0-7 mildly symptomatic   8-19 moderately symptomatic   20-35 severely symptomatic   Patient has a  scheduled follow up visit with his urologist, Dr. Annabell Howells, on 02/2023  for ongoing surveillance. He was counseled that PSA levels will be drawn in the urology office, and was reassured that additional time is expected to improve bowel and bladder symptoms. He was encouraged to call back with concerns or questions regarding radiation.  This concludes the interaction.  [name], LPN

## 2023-01-23 NOTE — ED Provider Notes (Signed)
[facility] EMERGENCY DEPARTMENT AT [facility] Provider Note   CSN: [id number] Arrival date & time: 01/23/23  1247     History  Chief Complaint  Patient presents with   Hematuria    Mark Li is a 67 y.o. male.  This is a 67 year old male who presents with hematuria x 1 day.  Patient has a history of kidney stones as well as prostate cancer.  Denies any fever or flank pain or dysuria.  States he did pass several clots but now feels fine.  Denies any suprapubic pressure.  Called his urologist and told to come here       Home Medications Prior to Admission medications   Medication Sig Start Date End Date Taking? Authorizing Provider  celecoxib (CELEBREX) 200 MG capsule Take 1 capsule (200 mg total) by mouth 2 (two) times daily. 11/14/22   Bruning, Ashlyn, PA-C  doxycycline (VIBRA-TABS) 100 MG tablet Take 1 tablet (100 mg total) by mouth 2 (two) times daily. 11/14/22   Bruning, Ashlyn, PA-C  ezetimibe (ZETIA) 10 MG tablet Take 1 tablet (10 mg total) by mouth daily. 12/05/22   Sandre Kitty, MD  HYDROcodone-acetaminophen (NORCO/VICODIN) 5-325 MG tablet Take 1-2 tablets by mouth every 6 (six) hours as needed for moderate pain. 11/14/22   Bruning, Ashlyn, PA-C  Incontinence Supply Disposable (INCONTINENCE BRIEF LARGE) MISC Use as needed for urinary and bowel incontinence 11/06/22   Sandre Kitty, MD  phenazopyridine (PYRIDIUM) 200 MG tablet Take 1 tablet (200 mg total) by mouth 3 (three) times daily as needed for pain. 11/14/22   Bruning, Ashlyn, PA-C  pravastatin (PRAVACHOL) 10 MG tablet Take by mouth. 07/13/22   [provider]  tamsulosin (FLOMAX) 0.4 MG CAPS capsule Take 1 capsule (0.4 mg total) by mouth daily after supper. 12/04/22   Sandre Kitty, MD      Allergies    Azithromycin dihydrate, Amoxicillin, Other, Penicillins, Vitamin c, and Zinc    Review of Systems   Review of Systems  All other systems reviewed and are negative.   Physical  Exam Updated Vital Signs BP (!) 150/91 (BP Location: Left Arm)   Pulse (!) 58   Temp 98.1 F (36.7 C) (Oral)   Resp 18   SpO2 100%  Physical Exam Vitals and nursing note reviewed.  Constitutional:      General: He is not in acute distress.    Appearance: Normal appearance. He is well-developed. He is not toxic-appearing.  HENT:     Head: Normocephalic and atraumatic.  Eyes:     General: Lids are normal.     Conjunctiva/sclera: Conjunctivae normal.     Pupils: Pupils are equal, round, and reactive to light.  Neck:     Thyroid: No thyroid mass.     Trachea: No tracheal deviation.  Cardiovascular:     Rate and Rhythm: Normal rate and regular rhythm.     Heart sounds: Normal heart sounds. No murmur heard.    No gallop.  Pulmonary:     Effort: Pulmonary effort is normal. No respiratory distress.     Breath sounds: Normal breath sounds. No stridor. No decreased breath sounds, wheezing, rhonchi or rales.  Abdominal:     General: There is no distension.     Palpations: Abdomen is soft.     Tenderness: There is no abdominal tenderness. There is no rebound.  Musculoskeletal:        General: No tenderness. Normal range of motion.  Cervical back: Normal range of motion and neck supple.  Skin:    General: Skin is warm and dry.     Findings: No abrasion or rash.  Neurological:     Mental Status: He is alert and oriented to person, place, and time. Mental status is at baseline.     GCS: GCS eye subscore is 4. GCS verbal subscore is 5. GCS motor subscore is 6.     Cranial Nerves: Cranial nerves are intact. No cranial nerve deficit.     Sensory: No sensory deficit.     Motor: Motor function is intact.  Psychiatric:        Attention and Perception: Attention normal.        Speech: Speech normal.        Behavior: Behavior normal.     ED Results / Procedures / Treatments   Labs (all labs ordered are listed, but only abnormal results are displayed) Labs Reviewed  URINALYSIS,  ROUTINE W REFLEX MICROSCOPIC - Abnormal; Notable for the following components:      Result Value   Hgb urine dipstick MODERATE (*)    All other components within normal limits  CBC  BASIC METABOLIC PANEL    EKG None  Radiology No results found.  Procedures Procedures    Medications Ordered in ED Medications - No data to display  ED Course/ Medical Decision Making/ A&P                                 Medical Decision Making Amount and/or Complexity of Data Reviewed Labs: ordered. Radiology: ordered.   Patient's urinalysis shows hematuria.  No concern for obstruction at this time.  Patient hemoglobin stable at 15.8.  Will check be met as well as renal CT and signed to Dr. Posey Rea        Final Clinical Impression(s) / ED Diagnoses Final diagnoses:  None    Rx / DC Orders ED Discharge Orders     None         Lorre Nick, MD 01/23/23 [phone]

## 2023-01-23 NOTE — ED Notes (Signed)
Patient is being discharged from the Urgent Care and sent to the Emergency Department via POV . Per [facility], patient is in need of higher level of care due to hematuria. Patient is aware and verbalizes understanding of plan of care.  Vitals:   01/23/23 1118  BP: 135/85  Pulse: (!) 55  Temp: 98.1 F (36.7 C)  SpO2: 99%

## 2023-01-23 NOTE — Discharge Instructions (Addendum)
Your urine does not show signs of infection and given diagnosis of prostate cancer, you will need further diagnostic work-up in the setting of the Emergency Department

## 2023-01-23 NOTE — ED Triage Notes (Signed)
Patient states he had to stop radiation treatment due to an infection in tooth, had it extracted yesterday. Presents with blood in urine that he notice first at 3 am and blood clots while in shower getting ready for work.

## 2023-01-23 NOTE — ED Triage Notes (Signed)
Pt arrived with hematuria that started at 3am. Denies abdominal/flank pain. States had prostate Ca earlier this year. Last radiation treatment 3 months ago. Also reports kidney stone in the past

## 2023-01-23 NOTE — ED Provider Notes (Signed)
[facility]    CSN: [id number] Arrival date & time: 01/23/23  1033      History   Chief Complaint Chief Complaint  Patient presents with   Hematuria    HPI Mark Li is a 67 y.o. male.   HPI  Past Medical History:  Diagnosis Date   Cancer (HCC)    basal and squamous skin cancer   History of kidney stones    Hypertension    Pneumonia    Sleep apnea    cpap sometimes   Sleep disturbance 03/20/2013    Patient Active Problem List   Diagnosis Date Noted   Urinary retention 12/04/2022   Acute diarrhea 12/04/2022   Acute prostatitis 12/04/2022   Urinary incontinence without sensory awareness 11/06/2022   Former smoker 07/11/2022   Prostate cancer (HCC) 05/26/2022   Chronic pain of both knees 01/17/2022   Hyperlipidemia 01/17/2022   Chronic bilateral thoracic back pain 01/13/2020   History of skin cancer 09/14/2017   Gastroesophageal reflux disease 03/03/2016   Essential hypertension 11/18/2015   Sleep apnea 10/15/2015   Insomnia 07/14/2015   Primary prostate adenocarcinoma (HCC) 07/14/2015   Sleep disturbance 03/20/2013    Past Surgical History:  Procedure Laterality Date   APPENDECTOMY     FRACTURE SURGERY     right foot crushed   GOLD SEED IMPLANT N/A 10/03/2022   Procedure: GOLD SEED IMPLANT;  Surgeon: Crist Fat, MD;  Location: [facility];  Service: Urology;  Laterality: N/A;   PROSTATE BIOPSY N/A 03/28/2022   Procedure: BIOPSY TRANSRECTAL ULTRASONIC PROSTATE (TUBP);  Surgeon: Bjorn Pippin, MD;  Location: [facility];  Service: Urology;  Laterality: N/A;  45 MINS FOR CASE   SPACE OAR INSTILLATION N/A 10/03/2022   Procedure: SPACE OAR INSTILLATION;  Surgeon: Crist Fat, MD;  Location: [facility];  Service: Urology;  Laterality: N/A;  30 MINS FOR CASE       Home Medications    Prior to Admission medications   Medication Sig Start Date End Date Taking? Authorizing Provider  celecoxib  (CELEBREX) 200 MG capsule Take 1 capsule (200 mg total) by mouth 2 (two) times daily. 11/14/22   Bruning, Ashlyn, PA-C  doxycycline (VIBRA-TABS) 100 MG tablet Take 1 tablet (100 mg total) by mouth 2 (two) times daily. 11/14/22   Bruning, Ashlyn, PA-C  ezetimibe (ZETIA) 10 MG tablet Take 1 tablet (10 mg total) by mouth daily. 12/05/22   Sandre Kitty, MD  HYDROcodone-acetaminophen (NORCO/VICODIN) 5-325 MG tablet Take 1-2 tablets by mouth every 6 (six) hours as needed for moderate pain. 11/14/22   Bruning, Ashlyn, PA-C  Incontinence Supply Disposable (INCONTINENCE BRIEF LARGE) MISC Use as needed for urinary and bowel incontinence 11/06/22   Sandre Kitty, MD  phenazopyridine (PYRIDIUM) 200 MG tablet Take 1 tablet (200 mg total) by mouth 3 (three) times daily as needed for pain. 11/14/22   Bruning, Ashlyn, PA-C  pravastatin (PRAVACHOL) 10 MG tablet Take by mouth. 07/13/22   [provider]  tamsulosin (FLOMAX) 0.4 MG CAPS capsule Take 1 capsule (0.4 mg total) by mouth daily after supper. 12/04/22   Sandre Kitty, MD    Family History Family History  Problem Relation Age of Onset   COPD Mother    Heart disease Father     Social History Social History   Tobacco Use   Smoking status: Former    Types: Cigarettes   Smokeless tobacco: Never   Tobacco comments:  Quit 20 years ago not an everyday smoker even when smoked  Vaping Use   Vaping status: Never Used  Substance Use Topics   Alcohol use: Yes    Comment: 10 drinks (beer, wine, liquor) per week   Drug use: No     Allergies   Azithromycin dihydrate, Amoxicillin, Other, Penicillins, Vitamin c, and Zinc   Review of Systems Review of Systems   Physical Exam Triage Vital Signs ED Triage Vitals  Encounter Vitals Group     BP 01/23/23 1118 135/85     Systolic BP Percentile --      Diastolic BP Percentile --      Pulse Rate 01/23/23 1118 (!) 55     Resp --      Temp 01/23/23 1118 98.1 F (36.7 C)     Temp Source  01/23/23 1118 Oral     SpO2 01/23/23 1118 99 %     Weight 01/23/23 1116 235 lb (106.6 kg)     Height 01/23/23 1116 5\' 11"  (1.803 m)     Head Circumference --      Peak Flow --      Pain Score 01/23/23 1116 2     Pain Loc --      Pain Education --      Exclude from Growth Chart --    No data found.  Updated Vital Signs BP 135/85 (BP Location: Left Arm)   Pulse (!) 55   Temp 98.1 F (36.7 C) (Oral)   Ht 5\' 11"  (1.803 m)   Wt 235 lb (106.6 kg)   SpO2 99%   BMI 32.78 kg/m   Visual Acuity Right Eye Distance:   Left Eye Distance:   Bilateral Distance:    Right Eye Near:   Left Eye Near:    Bilateral Near:     Physical Exam   UC Treatments / Results  Labs (all labs ordered are listed, but only abnormal results are displayed) Labs Reviewed  POCT URINALYSIS DIP (MANUAL ENTRY) - Abnormal; Notable for the following components:      Result Value   Color, UA red (*)    Clarity, UA cloudy (*)    Blood, UA large (*)    Protein Ur, POC =30 (*)    All other components within normal limits    EKG   Radiology No results found.  Procedures Procedures (including critical care time)  Medications Ordered in UC Medications - No data to display  Initial Impression / Assessment and Plan / UC Course  I have reviewed the triage vital signs and the nursing notes.  Pertinent labs & imaging results that were available during my care of the patient were reviewed by me and considered in my medical decision making (see chart for details).     *** Final Clinical Impressions(s) / UC Diagnoses   Final diagnoses:  Gross hematuria  Prostate cancer [facility])     Discharge Instructions      Your urine does not show signs of infection and given diagnosis of prostate cancer, you will need further diagnostic work-up in the setting of the Emergency Department     ED Prescriptions   None    PDMP not reviewed this encounter.

## 2023-01-23 NOTE — Telephone Encounter (Signed)
  Chief Complaint: blood in urine Symptoms: blood in urine, lima bean size clots since 0300.  Frequency: every time urinates Pertinent Negatives: Patient denies new pain/sensitivity, patient denies foul odor, pt denies fever, denies blood thinners, denies injury Disposition: [x] ED /[] Urgent Care (no appt availability in office) / [] Appointment(In office/virtual)/ []  [facility]/ [] Home Care/ [] Refused Recommended Disposition /[] [place] [facility]/ []  Follow-up with PCP Additional Notes: pt hx of prostate CA, getting radiation treatment, states he does not always know when he has to urinate so unsure if there is incontinence different than usual. Attempted to schedule today with PCP. No available appt, pt advised to go to the ED given his cancer hx and passing clots. Pt confirms he will go to nearest ED  Copied from CRM [id number]. Topic: Clinical - Red Word Triage >> Jan 23, 2023  9:29 AM Conni Elliot wrote: Red Word that prompted transfer to Nurse Triage: blood/blood clots in urine Answer Assessment - Initial Assessment Questions 1. SYMPTOM: "What's the main symptom you're concerned about?" (e.g., frequency, incontinence)     Blood in urine 2. ONSET: "When did the  blood in the urine start?"     0300 today 3. PAIN: "Is there any pain?" If Yes, ask: "How bad is it?" (Scale: 1-10; mild, moderate, severe)     No new pain, sensitivity x3 weeks 4. CAUSE: "What do you think is causing the symptoms?"     Tooth extraction yesterday, pt is not sure if it is related.  5. OTHER SYMPTOMS: "Do you have any other symptoms?" (e.g., blood in urine, fever, flank pain, pain with urination)     denies  Protocols used: Urinary Symptoms-A-AH  Reason for Disposition  Patient sounds very sick or weak to the triager  Answer Assessment - Initial Assessment Questions 1. SYMPTOM: "What's the main symptom you're concerned about?" (e.g., frequency, incontinence)     Blood in urine 2. ONSET: "When did the   blood in the urine start?"     0300 today 3. PAIN: "Is there any pain?" If Yes, ask: "How bad is it?" (Scale: 1-10; mild, moderate, severe)     No new pain, sensitivity x3 weeks 4. CAUSE: "What do you think is causing the symptoms?"     Tooth extraction yesterday, pt is not sure if it is related.  5. OTHER SYMPTOMS: "Do you have any other symptoms?" (e.g., blood in urine, fever, flank pain, pain with urination)     denies  Protocols used: Urinary Symptoms-A-AH

## 2023-01-31 ENCOUNTER — Other Ambulatory Visit: Payer: Self-pay | Admitting: Urology

## 2023-02-05 ENCOUNTER — Ambulatory Visit: Payer: Medicare Other | Admitting: Family Medicine

## 2023-02-05 NOTE — Progress Notes (Unsigned)
   Established Patient Office Visit  Subjective   Patient ID: Mark Li, male    DOB: 1955-10-02  Age: 67 y.o. MRN: [id number]  No chief complaint on file.   HPI  Prostate cancer  Hyperlipidemia-did he start Zetia?  Recheck cholesterol today.  Bladder stone   The 10-year ASCVD risk score (Arnett DK, et al., 2019) is: 21.3%  Health Maintenance Due  Topic Date Due   Hepatitis C Screening  Never done   Colonoscopy  10/23/2016   Zoster Vaccines- Shingrix (2 of 2) 03/02/2020   COVID-19 Vaccine (5 - 2023-24 season) 01/03/2023      Objective:     There were no vitals taken for this visit. {Vitals History (Optional):23777}  Physical Exam   No results found for any visits on 02/05/23.      Assessment & Plan:   There are no diagnoses linked to this encounter.   No follow-ups on file.    Sandre Kitty, MD

## 2023-02-06 ENCOUNTER — Encounter: Payer: Self-pay | Admitting: Family Medicine

## 2023-02-06 ENCOUNTER — Ambulatory Visit (INDEPENDENT_AMBULATORY_CARE_PROVIDER_SITE_OTHER): Payer: Medicare Other | Admitting: Family Medicine

## 2023-02-06 VITALS — BP 161/94 | HR 59 | Ht 71.0 in | Wt 243.4 lb

## 2023-02-06 DIAGNOSIS — I10 Essential (primary) hypertension: Secondary | ICD-10-CM | POA: Diagnosis not present

## 2023-02-06 DIAGNOSIS — N210 Calculus in bladder: Secondary | ICD-10-CM | POA: Diagnosis not present

## 2023-02-06 DIAGNOSIS — E782 Mixed hyperlipidemia: Secondary | ICD-10-CM | POA: Diagnosis not present

## 2023-02-06 MED ORDER — PRAVASTATIN SODIUM 10 MG PO TABS: 10.0000 mg | ORAL_TABLET | Freq: Every day | ORAL | 1 refills | Status: DC

## 2023-02-06 NOTE — Patient Instructions (Signed)
It was nice to see you today,  We addressed the following topics today: - f/u with me 6 weeks.   - I have sent in new prescription for your pravastatin.  Continue taking this and your zetia  Have a great day,  Frederic Jericho, MD

## 2023-02-06 NOTE — Assessment & Plan Note (Signed)
Restart pravastatin and Zetia today.  If necessary patient can try Repatha or bempedoic acid.  Recheck cholesterol in 6 weeks.

## 2023-02-06 NOTE — Assessment & Plan Note (Signed)
Previously on blood pressure medicine prior to coming off of it after changes to diet and lifestyle.  Blood pressure elevated today.  Will need to recheck at next visit after his issues with renal stones has resolved.

## 2023-02-06 NOTE — Progress Notes (Signed)
   Established Patient Office Visit  Subjective   Patient ID: Mark Li, male    DOB: Jun 12, 1955  Age: 67 y.o. MRN: [id number]  Chief Complaint  Patient presents with   Medical Management of Chronic Issues    HPI  HLD -patient had stopped taking his Zetia and pravastatin while getting cancer treatment.  He had previously thought his leg pain was due to statins but after stopping statins and continue to have leg pain he does not believe this to be the case now.  He states that he has thigh, knee, calf pain, dull aching.  Also has some swelling in the calves.  He has compression stockings.  Patient would like to start these medications again, does not have any pravastatin left.  Patient has a bladder stone and has a urology appointment and possible cystoscopy scheduled next week.  Patient currently taking Flomax twice a day.  Hypertension-patient previously took what he thinks is lisinopril for blood pressure prior to taking Zepbound and also prior to using a CPAP machine for OSA.  Stopped taking it after blood pressure improved.  We discussed his elevated pressure today and at the hospital.  Advised him that we will need to recheck it after his stone is resolved and if still elevated will discuss restarting medication.    The 10-year ASCVD risk score (Arnett DK, et al., 2019) is: 23.3%  Health Maintenance Due  Topic Date Due   Hepatitis C Screening  Never done   Colonoscopy  10/23/2016   Zoster Vaccines- Shingrix (2 of 2) 03/02/2020   COVID-19 Vaccine (5 - 2023-24 season) 01/03/2023      Objective:     BP (!) 161/94   Pulse (!) 59   Ht 5\' 11"  (1.803 m)   Wt 243 lb 6.4 oz (110.4 kg)   SpO2 98%   BMI 33.95 kg/m    Physical Exam  General: Alert, oriented CV: Regular rhythm Pulmonary: Lungs clear Extremities: 1+ pitting edema in the calves.    No results found for any visits on 02/06/23.      Assessment & Plan:   Essential hypertension Assessment &  Plan: Previously on blood pressure medicine prior to coming off of it after changes to diet and lifestyle.  Blood pressure elevated today.  Will need to recheck at next visit after his issues with renal stones has resolved.   Mixed hyperlipidemia Assessment & Plan: Restart pravastatin and Zetia today.  If necessary patient can try Repatha or bempedoic acid.  Recheck cholesterol in 6 weeks.   Bladder stone Assessment & Plan: Patient has bladder stone identified by CT scan recently.  Has upcoming urology appointment.  Takes Flomax.      Return in about 7 weeks (around 03/27/2023) for hld.    Sandre Kitty, MD

## 2023-02-06 NOTE — Assessment & Plan Note (Signed)
Patient has bladder stone identified by CT scan recently.  Has upcoming urology appointment.  Takes Flomax.

## 2023-02-07 NOTE — Patient Instructions (Signed)
SURGICAL WAITING ROOM VISITATION Patients having surgery or a procedure may have no more than 2 support people in the waiting area - these visitors may rotate in the visitor waiting room.   Due to an increase in RSV and influenza rates and associated hospitalizations, children ages 2 and under may not visit patients in [facility] hospitals. If the patient needs to stay at the hospital during part of their recovery, the visitor guidelines for inpatient rooms apply.  PRE-OP VISITATION  Pre-op nurse will coordinate an appropriate time for 1 support person to accompany the patient in pre-op.  This support person may not rotate.  This visitor will be contacted when the time is appropriate for the visitor to come back in the pre-op area.  Please refer to the [facility] website for the visitor guidelines for Inpatients (after your surgery is over and you are in a regular room).  You are not required to quarantine at this time prior to your surgery. However, you must do this: Hand Hygiene often Do NOT share personal items Notify your provider if you are in close contact with someone who has COVID or you develop fever 100.4 or greater, new onset of sneezing, cough, sore throat, shortness of breath or body aches.  If you test positive for Covid or have been in contact with anyone that has tested positive in the last 10 days please notify you surgeon.    Your procedure is scheduled on: Monday   February 12, 2023   Report to [facility] Main Entrance: [address] entrance where the [employer] is available.   Report to admitting at: 2:15  PM  Call this number if you have any questions or problems the morning of surgery [phone]  DO NOT EAT OR DRINK ANYTHING AFTER MIDNIGHT THE NIGHT PRIOR TO YOUR SURGERY / PROCEDURE.   FOLLOW  ANY ADDITIONAL PRE OP INSTRUCTIONS YOU RECEIVED FROM YOUR SURGEON'S OFFICE!!!   Oral Hygiene is also important to reduce your risk of infection.        Remember  - BRUSH YOUR TEETH THE MORNING OF SURGERY WITH YOUR REGULAR TOOTHPASTE  Do NOT smoke after Midnight the night before surgery.  STOP TAKING all Vitamins, Herbs and supplements 1 week before your surgery.   Take ONLY these medicines the morning of surgery with A SIP OF WATER:  none  If You have been diagnosed with Sleep Apnea - Bring CPAP mask and tubing day of surgery. We will provide you with a CPAP machine on the day of your surgery.                   You may not have any metal on your body including jewelry, and body piercing  Do not wear  lotions, powders,  cologne, or deodorant  Men may shave face and neck.  Contacts, Hearing Aids, dentures or bridgework may not be worn into surgery. DENTURES WILL BE REMOVED PRIOR TO SURGERY PLEASE DO NOT APPLY "Poly grip" OR ADHESIVES!!!  Patients discharged on the day of surgery will not be allowed to drive home.  Someone NEEDS to stay with you for the first 24 hours after anesthesia.  Do not bring your home medications to the hospital. The Pharmacy will dispense medications listed on your medication list to you during your admission in the Hospital.  Please read over the following fact sheets you were given: IF YOU HAVE QUESTIONS ABOUT YOUR PRE-OP INSTRUCTIONS, PLEASE CALL [phone].   [facility] - Preparing  for Surgery Before surgery, you can play an important role.  Because skin is not sterile, your skin needs to be as free of germs as possible.  You can reduce the number of germs on your skin by washing with CHG (chlorahexidine gluconate) soap before surgery.  CHG is an antiseptic cleaner which kills germs and bonds with the skin to continue killing germs even after washing. Please DO NOT use if you have an allergy to CHG or antibacterial soaps.  If your skin becomes reddened/irritated stop using the CHG and inform your nurse when you arrive at Short Stay. Do not shave (including legs and underarms) for at least 48 hours prior to the first  CHG shower.  You may shave your face/neck.  Please follow these instructions carefully:  1.  Shower with CHG Soap the night before surgery and the  morning of surgery.  2.  If you choose to wash your hair, wash your hair first as usual with your normal  shampoo.  3.  After you shampoo, rinse your hair and body thoroughly to remove the shampoo.                             4.  Use CHG as you would any other liquid soap.  You can apply chg directly to the skin and wash.  Gently with a scrungie or clean washcloth.  5.  Apply the CHG Soap to your body ONLY FROM THE NECK DOWN.   Do not use on face/ open                           Wound or open sores. Avoid contact with eyes, ears mouth and genitals (private parts).                       Wash face,  Genitals (private parts) with your normal soap.             6.  Wash thoroughly, paying special attention to the area where your  surgery  will be performed.  7.  Thoroughly rinse your body with warm water from the neck down.  8.  DO NOT shower/wash with your normal soap after using and rinsing off the CHG Soap.            9.  Pat yourself dry with a clean towel.            10.  Wear clean pajamas.            11.  Place clean sheets on your bed the night of your first shower and do not  sleep with pets.  ON THE DAY OF SURGERY : Do not apply any lotions/deodorants the morning of surgery.  Please wear clean clothes to the hospital/surgery center.    FAILURE TO FOLLOW THESE INSTRUCTIONS MAY RESULT IN THE CANCELLATION OF YOUR SURGERY  PATIENT SIGNATURE_________________________________  NURSE SIGNATURE__________________________________  ________________________________________________________________________

## 2023-02-07 NOTE — Progress Notes (Signed)
The patient was identified using 2 approved identifiers. All issues noted in this document were discussed and addressed, Mr Mark Li  voiced understanding and agreement with all preoperative instructions. The patient was emailed the surgery instructions per his request.  [email or web address]   Medication Reconciliation done on 02-01-2023   The patient was instructed to call  Admitting Office [phone] or [phone]) to complete their Pre-surgical Interview.   COVID Vaccine received:  []  No [x]  Yes Date of any COVID positive Test in last 90 days:  PCP - Lenor Coffin, MD  Cardiologist - none Oncology- Margaretmary Dys, MD  Chest x-ray - 04-09-2019  1v  Epic EKG -  10-02-2022  Epic Stress Test -  ECHO -  Cardiac Cath -   PCR screen: []  Ordered & Completed []   No Order but Needs PROFEND     [x]   N/A for this surgery  Surgery Plan:  [x]  Ambulatory   []  Outpatient in bed  []  Admit Anesthesia:    [x]  General  []  Spinal  []   Choice []   MAC  Bowel Prep - [x]  No  []   Yes ______  Pacemaker / ICD device [x]  No []  Yes   Spinal Cord Stimulator:[x]  No []  Yes       History of Sleep Apnea? []  No [x]  Yes   CPAP used?- []  No [x]  Yes    Does the patient monitor blood sugar?   [x]  N/A   []  No []  Yes  Patient has: [x]  NO Hx DM   []  Pre-DM   []  DM1  []   DM2 Last A1c was:        on       Blood Thinner / Instructions: Aspirin Instructions:  ERAS Protocol Ordered: []  No  []  Yes PRE-SURGERY []  ENSURE  []  G2   []  No Drink Ordered  Patient is to be NPO after:   Dental hx: []  Dentures:  []  N/A      []  Bridge or Partial:                   []  Loose or Damaged teeth:   Comments:   Activity level: Patient is able / unable to climb a flight of stairs without difficulty; []  No CP  []  No SOB, but would have ___   Patient can / can not perform ADLs without assistance.   Anesthesia review: OSA-CPAP some, HTN no meds, GERD, s/p prostate surgery/radiation.   Patient denies shortness of breath, fever,  cough and chest pain at PAT appointment.  Patient verbalized understanding and agreement to the Pre-Surgical Instructions that were given to them at this PAT appointment. Patient was also educated of the need to review these PAT instructions again prior to his/her surgery.I reviewed the appropriate phone numbers to call if they have any and questions or concerns.

## 2023-02-08 ENCOUNTER — Encounter (HOSPITAL_COMMUNITY)
Admission: RE | Admit: 2023-02-08 | Discharge: 2023-02-08 | Disposition: A | Discharge status: Home / Self Care | Payer: Medicare Other | Source: Ambulatory Visit | Attending: Urology | Admitting: Urology

## 2023-02-08 ENCOUNTER — Other Ambulatory Visit: Payer: Self-pay

## 2023-02-08 ENCOUNTER — Encounter (HOSPITAL_COMMUNITY): Payer: Self-pay

## 2023-02-08 HISTORY — DX: Unspecified osteoarthritis, unspecified site: M19.90

## 2023-02-12 ENCOUNTER — Ambulatory Visit (HOSPITAL_COMMUNITY): Payer: Medicare Other | Admitting: Registered Nurse

## 2023-02-12 ENCOUNTER — Encounter (HOSPITAL_COMMUNITY)
Admission: RE | Disposition: A | Discharge status: Home / Self Care | Payer: Self-pay | Source: Ambulatory Visit | Attending: Urology

## 2023-02-12 ENCOUNTER — Ambulatory Visit (HOSPITAL_COMMUNITY)
Admission: RE | Admit: 2023-02-12 | Discharge: 2023-02-12 | Disposition: A | Discharge status: Home / Self Care | Payer: Medicare Other | Source: Ambulatory Visit | Attending: Urology | Admitting: Urology

## 2023-02-12 ENCOUNTER — Other Ambulatory Visit: Payer: Self-pay

## 2023-02-12 ENCOUNTER — Encounter (HOSPITAL_COMMUNITY): Payer: Self-pay | Admitting: Urology

## 2023-02-12 DIAGNOSIS — Z923 Personal history of irradiation: Secondary | ICD-10-CM | POA: Diagnosis not present

## 2023-02-12 DIAGNOSIS — M1990 Unspecified osteoarthritis, unspecified site: Secondary | ICD-10-CM | POA: Diagnosis not present

## 2023-02-12 DIAGNOSIS — Y842 Radiological procedure and radiotherapy as the cause of abnormal reaction of the patient, or of later complication, without mention of misadventure at the time of the procedure: Secondary | ICD-10-CM | POA: Insufficient documentation

## 2023-02-12 DIAGNOSIS — N320 Bladder-neck obstruction: Secondary | ICD-10-CM | POA: Insufficient documentation

## 2023-02-12 DIAGNOSIS — N210 Calculus in bladder: Secondary | ICD-10-CM | POA: Diagnosis not present

## 2023-02-12 DIAGNOSIS — R3912 Poor urinary stream: Secondary | ICD-10-CM | POA: Insufficient documentation

## 2023-02-12 DIAGNOSIS — K219 Gastro-esophageal reflux disease without esophagitis: Secondary | ICD-10-CM | POA: Insufficient documentation

## 2023-02-12 DIAGNOSIS — N3081 Other cystitis with hematuria: Secondary | ICD-10-CM | POA: Diagnosis present

## 2023-02-12 DIAGNOSIS — N3031 Trigonitis with hematuria: Secondary | ICD-10-CM | POA: Diagnosis not present

## 2023-02-12 DIAGNOSIS — Z87891 Personal history of nicotine dependence: Secondary | ICD-10-CM | POA: Diagnosis not present

## 2023-02-12 DIAGNOSIS — Z87442 Personal history of urinary calculi: Secondary | ICD-10-CM | POA: Insufficient documentation

## 2023-02-12 DIAGNOSIS — G4730 Sleep apnea, unspecified: Secondary | ICD-10-CM | POA: Insufficient documentation

## 2023-02-12 DIAGNOSIS — I10 Essential (primary) hypertension: Secondary | ICD-10-CM | POA: Diagnosis not present

## 2023-02-12 DIAGNOSIS — N400 Enlarged prostate without lower urinary tract symptoms: Secondary | ICD-10-CM | POA: Diagnosis not present

## 2023-02-12 DIAGNOSIS — N402 Nodular prostate without lower urinary tract symptoms: Secondary | ICD-10-CM | POA: Insufficient documentation

## 2023-02-12 DIAGNOSIS — C61 Malignant neoplasm of prostate: Secondary | ICD-10-CM | POA: Insufficient documentation

## 2023-02-12 HISTORY — PX: CYSTOSCOPY WITH LITHOLAPAXY: SHX1425

## 2023-02-12 SURGERY — CYSTOSCOPY, WITH BLADDER CALCULUS LITHOLAPAXY
Anesthesia: General | Site: Bladder

## 2023-02-12 MED ORDER — ONDANSETRON HCL 4 MG/2ML IJ SOLN
INTRAMUSCULAR | Status: DC | PRN
  Administered 2023-02-12: 4 mg via INTRAVENOUS

## 2023-02-12 MED ORDER — DEXAMETHASONE SODIUM PHOSPHATE 4 MG/ML IJ SOLN
INTRAMUSCULAR | Status: DC | PRN
  Administered 2023-02-12: 8 mg via INTRAVENOUS

## 2023-02-12 MED ORDER — CHLORHEXIDINE GLUCONATE 0.12 % MT SOLN
15.0000 mL | Freq: Once | OROMUCOSAL | Status: AC
  Administered 2023-02-12: 15 mL via OROMUCOSAL

## 2023-02-12 MED ORDER — OXYCODONE HCL 5 MG/5ML PO SOLN: 5.0000 mg | Freq: Once | ORAL | Status: AC | PRN

## 2023-02-12 MED ORDER — SODIUM CHLORIDE 0.9% FLUSH: 3.0000 mL | Freq: Two times a day (BID) | INTRAVENOUS | Status: DC

## 2023-02-12 MED ORDER — OXYCODONE HCL 5 MG PO TABS
5.0000 mg | ORAL_TABLET | Freq: Once | ORAL | Status: AC | PRN
  Administered 2023-02-12: 5 mg via ORAL

## 2023-02-12 MED ORDER — DROPERIDOL 2.5 MG/ML IJ SOLN
0.6250 mg | Freq: Once | INTRAMUSCULAR | Status: DC | PRN
Start: 2023-02-12 — End: 2023-02-13

## 2023-02-12 MED ORDER — SODIUM CHLORIDE 0.9 % IR SOLN
Status: DC | PRN
  Administered 2023-02-12: 3000 mL

## 2023-02-12 MED ORDER — PROPOFOL 10 MG/ML IV BOLUS
INTRAVENOUS | Status: AC
  Filled 2023-02-12: qty 20

## 2023-02-12 MED ORDER — FENTANYL CITRATE (PF) 100 MCG/2ML IJ SOLN
INTRAMUSCULAR | Status: DC | PRN
  Administered 2023-02-12: 50 ug via INTRAVENOUS
  Administered 2023-02-12 (×4): 25 ug via INTRAVENOUS
  Administered 2023-02-12: 50 ug via INTRAVENOUS

## 2023-02-12 MED ORDER — ORAL CARE MOUTH RINSE: 15.0000 mL | Freq: Once | OROMUCOSAL | Status: AC

## 2023-02-12 MED ORDER — FENTANYL CITRATE (PF) 100 MCG/2ML IJ SOLN
INTRAMUSCULAR | Status: AC
  Filled 2023-02-12: qty 2

## 2023-02-12 MED ORDER — STERILE WATER FOR IRRIGATION IR SOLN
Status: DC | PRN
  Administered 2023-02-12: 3000 mL

## 2023-02-12 MED ORDER — CIPROFLOXACIN IN D5W 400 MG/200ML IV SOLN
400.0000 mg | INTRAVENOUS | Status: AC
  Administered 2023-02-12: 400 mg via INTRAVENOUS
  Filled 2023-02-12: qty 200

## 2023-02-12 MED ORDER — HYDROCODONE-ACETAMINOPHEN 5-325 MG PO TABS: 1.0000 | ORAL_TABLET | Freq: Four times a day (QID) | ORAL | 0 refills | Status: DC | PRN

## 2023-02-12 MED ORDER — ACETAMINOPHEN 160 MG/5ML PO SOLN: 325.0000 mg | ORAL | Status: DC | PRN

## 2023-02-12 MED ORDER — ACETAMINOPHEN 325 MG PO TABS
325.0000 mg | ORAL_TABLET | ORAL | Status: DC | PRN
Start: 2023-02-12 — End: 2023-02-13

## 2023-02-12 MED ORDER — OXYCODONE HCL 5 MG PO TABS
ORAL_TABLET | ORAL | Status: AC
  Filled 2023-02-12: qty 1

## 2023-02-12 MED ORDER — PROPOFOL 10 MG/ML IV BOLUS
INTRAVENOUS | Status: DC | PRN
  Administered 2023-02-12: 200 mg via INTRAVENOUS

## 2023-02-12 MED ORDER — ACETAMINOPHEN 10 MG/ML IV SOLN: 1000.0000 mg | Freq: Once | INTRAVENOUS | Status: DC | PRN

## 2023-02-12 MED ORDER — MIDAZOLAM HCL 2 MG/2ML IJ SOLN
INTRAMUSCULAR | Status: AC
  Filled 2023-02-12: qty 2

## 2023-02-12 MED ORDER — FENTANYL CITRATE PF 50 MCG/ML IJ SOSY: 25.0000 ug | PREFILLED_SYRINGE | INTRAMUSCULAR | Status: DC | PRN

## 2023-02-12 MED ORDER — DEXAMETHASONE SODIUM PHOSPHATE 10 MG/ML IJ SOLN
INTRAMUSCULAR | Status: AC
  Filled 2023-02-12: qty 1

## 2023-02-12 MED ORDER — LIDOCAINE HCL (PF) 2 % IJ SOLN
INTRAMUSCULAR | Status: AC
  Filled 2023-02-12: qty 5

## 2023-02-12 MED ORDER — ONDANSETRON HCL 4 MG/2ML IJ SOLN
INTRAMUSCULAR | Status: AC
  Filled 2023-02-12: qty 2

## 2023-02-12 MED ORDER — LACTATED RINGERS IV SOLN: INTRAVENOUS | Status: DC

## 2023-02-12 MED ORDER — LIDOCAINE HCL (CARDIAC) PF 100 MG/5ML IV SOSY
PREFILLED_SYRINGE | INTRAVENOUS | Status: DC | PRN
  Administered 2023-02-12: 60 mg via INTRAVENOUS

## 2023-02-12 MED ORDER — MIDAZOLAM HCL 5 MG/5ML IJ SOLN
INTRAMUSCULAR | Status: DC | PRN
  Administered 2023-02-12: 2 mg via INTRAVENOUS

## 2023-02-12 SURGICAL SUPPLY — 17 items
BAG URINE DRAIN 2000ML AR STRL (UROLOGICAL SUPPLIES) IMPLANT
BAG URO CATCHER STRL LF (MISCELLANEOUS) ×1 IMPLANT
CATH FOLEY 2WAY SLVR 5CC 20FR (CATHETERS) IMPLANT
CATH URETL OPEN 5X70 (CATHETERS) IMPLANT
CLOTH BEACON ORANGE TIMEOUT ST (SAFETY) ×1 IMPLANT
GLOVE SURG SS PI 8.0 STRL IVOR (GLOVE) IMPLANT
GOWN STRL REUS W/ TWL XL LVL3 (GOWN DISPOSABLE) ×1 IMPLANT
KIT TURNOVER KIT A (KITS) IMPLANT
LASER FIB FLEXIVA PULSE ID 550 (Laser) IMPLANT
LASER FIB FLEXIVA PULSE ID 910 (Laser) IMPLANT
LOOP CUT BIPOLAR 24F LRG (ELECTROSURGICAL) IMPLANT
MANIFOLD NEPTUNE II (INSTRUMENTS) ×1 IMPLANT
PACK CYSTO (CUSTOM PROCEDURE TRAY) ×1 IMPLANT
SYR TOOMEY IRRIG 70ML (MISCELLANEOUS)
SYRINGE TOOMEY IRRIG 70ML (MISCELLANEOUS) IMPLANT
TUBING CONNECTING 10 (TUBING) IMPLANT
TUBING UROLOGY SET (TUBING) ×1 IMPLANT

## 2023-02-12 NOTE — Discharge Instructions (Addendum)
CYSTOSCOPY HOME CARE INSTRUCTIONS  Activity: Rest for the remainder of the day.  Do not drive or operate equipment today.  You may resume normal activities in one to two days as instructed by your physician.   Meals: Drink plenty of liquids and eat light foods such as gelatin or soup this evening.  You may return to a normal meal plan tomorrow.  Return to Work: You may return to work in one to two days or as instructed by your physician.  Catheter care:   You may remove the catheter on Thursday morning by following the instruction above.   Special Instructions / Symptoms: Call your physician if any of these symptoms occur:   -persistent or heavy bleeding  -bleeding which continues after first few urination  -large blood clots that are difficult to pass  -urine stream diminishes or stops completely  -fever equal to or higher than 101 degrees Farenheit.  -cloudy urine with a strong, foul odor  -severe pain    Patient Signature:  ________________________________________________________  Nurse's Signature:  ________________________________________________________

## 2023-02-12 NOTE — Interval H&P Note (Signed)
History and Physical Interval Note:  His voiding symptoms and hematuria have improved.   02/12/2023 4:17 PM  Mark Li  has presented today for surgery, with the diagnosis of BLADDER STONE.  The various methods of treatment have been discussed with the patient and family. After consideration of risks, benefits and other options for treatment, the patient has consented to  Procedure(s): CYSTOSCOPY BLADDER STONE REMOVAL, POSSIBLE LASER (N/A) as a surgical intervention.  The patient's history has been reviewed, patient examined, no change in status, stable for surgery.  I have reviewed the patient's chart and labs.  Questions were answered to the patient's satisfaction.     Bjorn Pippin

## 2023-02-12 NOTE — Op Note (Signed)
Procedure: Cystoscopy with biopsy and fulguration of bladder neck.  Preop diagnosis: Bladder neck stone with hematuria.  Postop diagnosis: Radiation changes with bleeding bladder neck obstruction.  Surgeon: Dr. Bjorn Pippin.  Anesthesia: General.  Specimen: Prostate bladder neck tissue.  Drain: 20 [place] Foley catheter.  EBL: 5 mL.  Complications: None.  Indications: Patient is a 67 year old male with a history of stones who was recently had increased urinary symptoms and hematuria with findings on CT of the stone at the bladder neck.  He has recently completed radiation therapy.  It was felt that cystoscopy and stone removal was indicated.  Procedure: He was taken the operating room was given Cipro.  A general anesthetic was induced.  He was placed in lithotomy position.  His perineum and genitalia were prepped Betadine solution he was draped in usual sterile fashion.  Cystoscopy was performed using a 21 [place] scope and 30 degree lens.  The meatus was tight but I was able to get the scope in with the obturator.  Examination revealed a normal urethra.  The external sphincter was intact.  The prostatic urethra was stiff with a very high bladder neck and middle lobe.  Examination of the prostate urethra revealed no residual stone.  Inspection of the bladder revealed inflammatory changes at the bladder base with edema consistent with radiation therapy but he began to have more significant bleeding and it was felt that fulguration was indicated.  Inspection of the remainder of the bladder demonstrates he did some mild trabeculation with no papillary tumors, the ureteral orifices had some edema but were otherwise unremarkable.  I calibrated the urethra to 15 [place] and passed the 26 [place] continuous-flow resectoscope sheath.  He was having some brisk bleeding from the left bladder neck area so the edematous mucosa was resected and fulgurated.  He had further bleeding into the middle lobe so  additional tissue was resected there.  Once the hemostasis had been achieved.  Final inspection revealed retained ureteral orifice ease, the tissue segments were removed.  And final inspection revealed no active bleeding.  The scope was removed and pressure on the bladder produced good stream but it was felt a Foley was indicated.  A 20 French Foley catheter was inserted without difficulty.  The balloon was filled with 10 mL of sterile fluid.  The cath was irrigated until the return was clear.  He was taken down for lithotomy position, his anesthetic was reversed and he was moved recovery in stable condition.  There were no complications.

## 2023-02-12 NOTE — Anesthesia Procedure Notes (Signed)
Procedure Name: LMA Insertion Date/Time: 02/12/2023 5:01 PM  Performed by: Maurene Capes, CRNAPre-anesthesia Checklist: Patient identified, Emergency Drugs available, Suction available and Patient being monitored Patient Re-evaluated:Patient Re-evaluated prior to induction Oxygen Delivery Method: Circle System Utilized Preoxygenation: Pre-oxygenation with 100% oxygen Induction Type: IV induction Ventilation: Mask ventilation without difficulty LMA: LMA inserted LMA Size: 5.0 Number of attempts: 1 Placement Confirmation: positive ETCO2 Tube secured with: Tape Dental Injury: Teeth and Oropharynx as per pre-operative assessment

## 2023-02-12 NOTE — Anesthesia Preprocedure Evaluation (Addendum)
Anesthesia Evaluation  Patient identified by MRN, date of birth, ID band Patient awake    Reviewed: Allergy & Precautions, NPO status , Patient's Chart, lab work & pertinent test results  Airway Mallampati: II  TM Distance: >3 FB Neck ROM: Full    Dental  (+) Teeth Intact, Dental Advisory Given   Pulmonary sleep apnea , former smoker   breath sounds clear to auscultation       Cardiovascular hypertension,  Rhythm:Regular Rate:Normal     Neuro/Psych negative neurological ROS  negative psych ROS   GI/Hepatic Neg liver ROS,GERD  ,,  Endo/Other  negative endocrine ROS    Renal/GU negative Renal ROS     Musculoskeletal  (+) Arthritis ,    Abdominal   Peds  Hematology negative hematology ROS (+)   Anesthesia Other Findings   Reproductive/Obstetrics                             Anesthesia Physical Anesthesia Plan  ASA: 3  Anesthesia Plan: General   Post-op Pain Management: Tylenol PO (pre-op)*   Induction: Intravenous  PONV Risk Score and Plan: 3 and Ondansetron, Dexamethasone and Midazolam  Airway Management Planned: LMA  Additional Equipment: None  Intra-op Plan:   Post-operative Plan: Extubation in OR  Informed Consent: I have reviewed the patients History and Physical, chart, labs and discussed the procedure including the risks, benefits and alternatives for the proposed anesthesia with the patient or authorized representative who has indicated his/her understanding and acceptance.     Dental advisory given  Plan Discussed with: CRNA  Anesthesia Plan Comments:        Anesthesia Quick Evaluation

## 2023-02-12 NOTE — Transfer of Care (Signed)
Immediate Anesthesia Transfer of Care Note  Patient: Mark Li  Procedure(s) Performed: CYSTOSCOPY BLADDER STONE REMOVAL, TURBT (Bladder)  Patient Location: PACU  Anesthesia Type:General  Level of Consciousness: drowsy and patient cooperative  Airway & Oxygen Therapy: Patient Spontanous Breathing and Patient connected to nasal cannula oxygen  Post-op Assessment: Report given to RN and Post -op Vital signs reviewed and stable  Post vital signs: Reviewed and stable  Last Vitals:  Vitals Value Taken Time  BP 147/96 02/12/23 1757  Temp    Pulse 75 02/12/23 1758  Resp 11 02/12/23 1758  SpO2 98 % 02/12/23 1758  Vitals shown include unfiled device data.  Last Pain:  Vitals:   02/12/23 1757  TempSrc:   PainSc: 0-No pain         Complications: No notable events documented.

## 2023-02-12 NOTE — H&P (Signed)
I have prostate cancer.  HPI: Mark Li is a 67 year-old male established patient who is here evaluation for treatment of prostate cancer.    01/30/23: Mark Li returns today in f/u. He completed radiation for the prostate cancer a week ago but had pain during the treatment and was in the ER on 01/23/23 with gross hematuria but no pain. He had a 6mm stone in the bladder. He had a similar sized stone in the left proximal ureter in 5/23 and had ESWL at [facility]. He passed no fragments and had no f/u imaging. He is on tamsulosin and is taking it twice daily. He has some fecal loss with voiding.   04/26/22: Mark Li returns today discuss his recent biopsy which was done for an enlarging right apical nodule with a PIRADS 4 lesion on MRI in this area and a chronically elevated PSA.   Prostate volume 65ml.   PSA was 6.5 but stable since 2010.   Path: GG2 in 10% of right mid prostate nodule that was hypoechoic on [place] and consistent with the MRI lesion and the palpable nodule. the 12 template biopsies were negative. There was insufficient tissue for a Prolaris test.   Stage T2a Nx Mx. MRI was negative for nodes.   [facility] nomogram: 67% OCD, 31% ECE, 3% LNI and 2% SVI.   CAPRA 3.   IPSS is 3.   SHIM is 25.     AUA Symptom Score: 50% of the time he has the sensation of not emptying his bladder completely when finished urinating. 50% of the time he has to urinate again fewer than two hours after he has finished urinating. 50% of the time he has to start and stop again several times when he urinates. 50% of the time he finds it difficult to postpone urination. 50% of the time he has a weak urinary stream. 50% of the time he has to push or strain to begin urination. He has to get up to urinate 3 times from the time he goes to bed until the time he gets up in the morning.   Calculated AUA Symptom Score: 21    ALLERGIES: Amoxicillin TABS Penicillin    MEDICATIONS: Tamsulosin Hcl 0.4 mg capsule  Zepbound      GU PSH: Prostate Needle Biopsy - 03/28/2022       PSH Notes: Preventive medication therapy needed, Appendectomy, Ankle Repair   NON-GU PSH: Appendectomy - 2010     GU PMH: Nocturia - 04/26/2022, (Stable), - 02/22/2022, He has nocturia x 2. , - 2022 Prostate Cancer, He has very low volume GG2 T2a Nx Mx intermediate risk prostate cancer with a well defined lesion on exam and imaging. He has minimal LUTS and no ED. I discussed options including surveillance and had hoped to have a Prolaris test to help guide that decision but there was insufficient tissue. I explained the pros and cons of surveillance and explained that he is not the typical candidate and if he was interested in pursuing that the a close follow up protocol with early rebiopsy would be prudent. I discusses RALP and reviewed the risks and benefits. He is not interested in surgical therapy. I discussed brachytherapy and EXRT and reviewed the risks and benefits of both and the use of SpaceOAR and it's added risks. His prostate is a bit large for brachytherapy but I will leave that judgement to Dr. Kathrynn Running but I did discussed downsizing with finasteride or possibly a short course of ADT and reviewed those side  effects. I mentioned cryotherapy and HIFU but his prostate seems a bit large for whole gland therapy; however he does have a very isolated, easy to image small cancer and is a potential candidate for focal therapy. I discussed that with him and explained that if he is interested in that it should be done in a protocol setting and that he might require additional biopsies prior to treatment or follow up biopsies after treatment. He is interested in discussing that so I will refer him to Dr. Harold Barban at [facility]. - 04/26/2022 Prostate nodule w/ LUTS - 04/26/2022, - 02/22/2022, - 2022 Elevated PSA, He has a stable PSA over 10+ years but has a palpable nodule that is larger than in 1/22 and MR evidence of a PIRADS 4 lesion in the area of the  nodule. I discussed options for management including a return to [facility] for an MR fusion biopsy because we can't use his current study in our system or a cognative fusion biopsy which I think would be very doable based on his exam and MRI findings. He would like sedation so I will get him set up in the OR for the biopsy. I reviewed the procedure and the risks of bleeding, infection and difficulty voiding. - 02/22/2022, His PSA is minimally changed over the last 10 years. He has a very small indurated area at the right apex that is not documented on prior exams. I am going to have him return in 6 months with a repeat PSA for another exam. , - 2022, Elevated prostate specific antigen (PSA), - 2014 Urinary Frequency - 02/22/2022 Ureteral calculus - 2023 Ureteral obstruction secondary to calculous - 2023 Other microscopic hematuria, Microscopic hematuria - 2014      PMH Notes:  1898-02-27 00:00:00 - Note: Normal Routine History And Physical Adult  2009-07-29 16:45:58 - Note: Neoplasm Of The Prostate Gland  2008-12-28 14:43:34 - Note: Arthritis   NON-GU PMH: Personal history of other endocrine, nutritional and metabolic disease, History of hypercholesterolemia - 2014 Personal history of other specified conditions, History of heartburn - 2014 Arthritis GERD Hypercholesterolemia Hypertension Sleep Apnea    FAMILY HISTORY: 1 Daughter - Other 1 son - Other Acute Myocardial Infarction - Father, Uncle Breast Cancer - Mother copd - Mother Family Health Status - Father alive at age 62 - Runs In Family Family Health Status - Mother's Age - Runs In Family Family Health Status Number - Runs In Family nephrolithiasis - Uncle, Father Prostate Cancer - Uncle   SOCIAL HISTORY: Marital Status: Married Preferred Language: English; Race: White Current Smoking Status: Patient does not smoke anymore. Has not smoked since 02/28/1988.   Tobacco Use Assessment Completed: Used Tobacco in last 30 days? Drinks  4+ caffeinated drinks per day.     Notes: Tobacco Use, Caffeine Use, Marital History - Currently Married, Alcohol Use, Occupation:   REVIEW OF SYSTEMS:    GU Review Male:   Patient reports frequent urination, get up at night to urinate, leakage of urine, trouble starting your stream, and have to strain to urinate . Patient denies hard to postpone urination, burning/ pain with urination, stream starts and stops, erection problems, and penile pain.  Gastrointestinal (Upper):   Patient denies nausea, vomiting, and indigestion/ heartburn.  Gastrointestinal (Lower):   Patient denies diarrhea and constipation.  Constitutional:   Patient denies fever, night sweats, weight loss, and fatigue.  Skin:   Patient denies skin rash/ lesion and itching.  Eyes:   Patient denies blurred vision and double vision.  Ears/ Nose/ Throat:   Patient denies sore throat and sinus problems.  Hematologic/Lymphatic:   Patient denies swollen glands and easy bruising.  Cardiovascular:   Patient denies leg swelling and chest pains.  Respiratory:   Patient denies cough and shortness of breath.  Endocrine:   Patient denies excessive thirst.  Musculoskeletal:   Patient denies back pain and joint pain.  Neurological:   Patient denies headaches and dizziness.  Psychologic:   Patient denies depression and anxiety.   Notes: Urgency, weak stream sometimes    VITAL SIGNS:      01/30/2023 02:58 PM  Weight 238 lb / 107.95 kg  Height 72 in / 182.88 cm  BP 124/79 mmHg  Heart Rate 73 /min  Temperature 98.2 F / 36.7 C  BMI 32.3 kg/m   MULTI-SYSTEM PHYSICAL EXAMINATION:    Constitutional: Obese. No physical deformities. Normally developed. Good grooming.   Respiratory: Normal breath sounds. No labored breathing, no use of accessory muscles.   Cardiovascular: Regular rate and rhythm. No murmur, no gallop.      Complexity of Data:  Records Review:   AUA Symptom Score, Previous Hospital Records, Previous Patient Records  Urine  Test Review:   Urinalysis  X-Ray Review: KUB: Reviewed Films. Discussed With Patient.  C.T. Stone Protocol: Reviewed Films. Reviewed Report. Discussed With Patient.     12/18/21 11/09/20 09/01/20 01/13/20 07/29/09 12/28/08  PSA  Total PSA 6.5 ng/ml 5.24 ng/mL 6.6 ng/ml 7.1 ng/ml 6.83  6.35   Free PSA  0.83 ng/mL    0.70   % Free PSA  16 % PSA    11.0    Notes:                     ER records and labs reviewed.    PROCEDURES:         KUB - [id number]  A single view of the abdomen is obtained. He has a 5mm calcification over the mid line in the bladder consistent with the bladder stone seen on CT. He has gold seeds present and a right phlebolith but no other significant bone, gas or soft tissue abnormalities.       Patient confirmed No Neulasta OnPro Device.           Visit Complexity - G2211 Chronic management         Urinalysis w/Scope Dipstick Dipstick Cont'd Micro  Color: Yellow Bilirubin: Neg mg/dL WBC/hpf: 0 - 5/hpf  Appearance: Clear Ketones: Neg mg/dL RBC/hpf: 0 - 2/hpf  Specific Gravity: 1.025 Blood: Neg ery/uL Bacteria: Few (10-25/hpf)  pH: 5.5 Protein: 1+ mg/dL Cystals: NS (Not Seen)  Glucose: Neg mg/dL Urobilinogen: 0.2 mg/dL Casts: NS (Not Seen)    Nitrites: Neg Trichomonas: Not Present    Leukocyte Esterase: Neg leu/uL Mucous: Present      Epithelial Cells: NS (Not Seen)      Yeast: NS (Not Seen)      Sperm: Not Present    ASSESSMENT:      ICD-10 Details  1 GU:   Prostate Cancer - C61 Chronic, Stable - He just finished EXRT last week. He will need a PSA in about 3 months.   2   Bladder Stone - N21.0 Acute, Threat to Bodily Function - He has a 5mm bladder stone that is the likely cause of the bleeding and possibilty the intermittent difficulty voiding. I discussed options and will get him scheduled for a cystoscopy with stone removal but will give him a week  or two to try to pass it. Risks of the cystoscopy reviewed in detail. He will go to [facility] ER if he develops  retention.   3   Prostate nodule w/ LUTS - N40.3 Chronic, Worsening - stay on tamsulosin bid.   4   Weak Urinary Stream - R39.12 Chronic, Worsening   PLAN:           Orders X-Rays: KUB          Schedule Return Visit/Planned Activity: ASAP - Schedule Surgery

## 2023-02-13 ENCOUNTER — Encounter (HOSPITAL_COMMUNITY): Payer: Self-pay | Admitting: Urology

## 2023-02-13 NOTE — Anesthesia Postprocedure Evaluation (Signed)
Anesthesia Post Note  Patient: Mark Li  Procedure(s) Performed: CYSTOSCOPY BLADDER STONE REMOVAL, TURBT (Bladder)     Patient location during evaluation: PACU Anesthesia Type: General Level of consciousness: awake and alert Pain management: pain level controlled Vital Signs Assessment: post-procedure vital signs reviewed and stable Respiratory status: spontaneous breathing, nonlabored ventilation, respiratory function stable and patient connected to nasal cannula oxygen Cardiovascular status: blood pressure returned to baseline and stable Postop Assessment: no apparent nausea or vomiting Anesthetic complications: no   No notable events documented.  Last Vitals:  Vitals:   02/12/23 1800 02/12/23 1815  BP: (!) 142/99 (!) 154/98  Pulse: 72 71  Resp: 14 12  Temp:    SpO2: 98% 98%    Last Pain:  Vitals:   02/12/23 1815  TempSrc:   PainSc: 0-No pain                 Shelton Silvas

## 2023-02-14 LAB — SURGICAL PATHOLOGY

## 2023-03-12 ENCOUNTER — Encounter: Payer: Self-pay | Admitting: *Deleted

## 2023-03-13 ENCOUNTER — Other Ambulatory Visit: Payer: Self-pay | Admitting: Family Medicine

## 2023-03-13 ENCOUNTER — Telehealth: Payer: Self-pay

## 2023-03-13 MED ORDER — EZETIMIBE 10 MG PO TABS: 10.0000 mg | ORAL_TABLET | Freq: Every day | ORAL | 2 refills | Status: DC

## 2023-03-13 NOTE — Telephone Encounter (Signed)
 Prescription Request  03/13/2023  LOV: 02/06/23  What is the name of the medication or equipment? ezetimibe  (ZETIA ) 10 MG tablet   Have you contacted your pharmacy to request a refill? Yes   Which pharmacy would you like this sent to?  [facility] - [place], [place] - [address] [name] [address] Phone: [phone] Fax: [phone]    Patient notified that their request is being sent to the clinical staff for review and that they should receive a response within 2 business days.   Please advise at Mobile [phone] (mobile)

## 2023-03-13 NOTE — Telephone Encounter (Signed)
 Rx has been sent

## 2023-03-13 NOTE — Telephone Encounter (Signed)
 Copied from CRM [id number]. Topic: Clinical - Medication Refill >> Mar 13, 2023  9:33 AM Delon DASEN wrote: Most Recent Primary Care Visit:  Provider: CHANDRA TORIBIO POUR  Department: [facility]  Visit Type: OFFICE VISIT  Date: 02/06/2023  Medication: ezetimibe  (ZETIA ) 10 MG tablet  Has the patient contacted their pharmacy? No (Agent: If no, request that the patient contact the pharmacy for the refill. If patient does not wish to contact the pharmacy document the reason why and proceed with request.) (Agent: If yes, when and what did the pharmacy advise?)  Is this the correct pharmacy for this prescription? Yes If no, delete pharmacy and type the correct one.  This is the patient's preferred pharmacy:  [facility] - [place], [place] - [address] [place] [address] Phone: [phone] Fax: [phone]   Has the prescription been filled recently?   Is the patient out of the medication?   Has the patient been seen for an appointment in the last year OR does the patient have an upcoming appointment?   Can we respond through MyChart?   Agent: Please be advised that Rx refills may take up to 3 business days. We ask that you follow-up with your pharmacy.

## 2023-03-20 ENCOUNTER — Other Ambulatory Visit: Payer: Medicare Other

## 2023-03-20 DIAGNOSIS — E782 Mixed hyperlipidemia: Secondary | ICD-10-CM

## 2023-03-21 LAB — LIPID PANEL
Chol/HDL Ratio: 3.7 {ratio} (ref 0.0–5.0)
Cholesterol, Total: 190 mg/dL (ref 100–199)
HDL: 52 mg/dL (ref >39)
LDL Chol Calc (NIH): 121 mg/dL — ABNORMAL HIGH (ref 0–99)
Triglycerides: 94 mg/dL (ref 0–149)
VLDL Cholesterol Cal: 17 mg/dL (ref 5–40)

## 2023-03-22 ENCOUNTER — Encounter: Payer: Self-pay | Admitting: Family Medicine

## 2023-03-27 ENCOUNTER — Encounter: Payer: Self-pay | Admitting: Family Medicine

## 2023-03-27 ENCOUNTER — Ambulatory Visit (INDEPENDENT_AMBULATORY_CARE_PROVIDER_SITE_OTHER): Payer: Medicare Other | Admitting: Family Medicine

## 2023-03-27 VITALS — BP 127/84 | HR 60 | Ht 71.0 in | Wt 245.0 lb

## 2023-03-27 DIAGNOSIS — N3942 Incontinence without sensory awareness: Secondary | ICD-10-CM | POA: Diagnosis not present

## 2023-03-27 DIAGNOSIS — I10 Essential (primary) hypertension: Secondary | ICD-10-CM | POA: Diagnosis not present

## 2023-03-27 DIAGNOSIS — C61 Malignant neoplasm of prostate: Secondary | ICD-10-CM | POA: Diagnosis not present

## 2023-03-27 DIAGNOSIS — M1611 Unilateral primary osteoarthritis, right hip: Secondary | ICD-10-CM

## 2023-03-27 DIAGNOSIS — R361 Hematospermia: Secondary | ICD-10-CM

## 2023-03-27 DIAGNOSIS — N210 Calculus in bladder: Secondary | ICD-10-CM

## 2023-03-27 DIAGNOSIS — E782 Mixed hyperlipidemia: Secondary | ICD-10-CM

## 2023-03-27 NOTE — Progress Notes (Unsigned)
   Established Patient Office Visit  Subjective   Patient ID: Mark Li, male    DOB: [birth year]  Age: 68 y.o. MRN: [id number]  No chief complaint on file.   HPI  Cholesterol level-on Zetia and pravastatin.  Is he having any side effects?  LDL was 121  Leg pain - dull ache.  Right hip.  Can feel a tendon popping over the bone.  Will give out.  Doesn't see an orthopedist.  Saw one a year ago.  Not doing anything for it.    Wobbly in the feet.   Cramping at night.    Hypertension-still elevated?  Better.    Had bladder stone removal  Is finished radiation therapy.  When is his next oncology appointment?  Hematospermia.  Thicker, globs.  Pain with urination.  Tip of penis.  Has some bowel incontinnece with uranatoin.  Urgent urination.  Sometimes has urinary incontinence.    Checked uti twice   The 10-year ASCVD risk score (Arnett DK, et al., 2019) is: 16%  Health Maintenance Due  Topic Date Due   Hepatitis C Screening  Never done   Colonoscopy  10/23/2016   Zoster Vaccines- Shingrix (2 of 2) 03/02/2020   COVID-19 Vaccine (5 - 2024-25 season) 01/03/2023      Objective:     There were no vitals taken for this visit. {Vitals History (Optional):23777}  Physical Exam   No results found for any visits on 03/27/23.      Assessment & Plan:   There are no diagnoses linked to this encounter.   No follow-ups on file.    Sandre Kitty, MD

## 2023-03-27 NOTE — Patient Instructions (Addendum)
It was nice to see you today,  We addressed the following topics today: -For your leg pain you can stop taking your pravastatin.  Continue to take the Zetia but if you are still concerned about leg pain with Zetia let us know before you stop taking that - You should also reach out to Dr. Christell Constant and ask him if you can schedule a follow-up appointment regarding your hip pain again.  It may be time for a hip replacement - For pain relief you can try oral medication such as Tylenol or Aleve.  Topical medications you can try include Voltaren gel, lidocaine gel, or gels containing menthol.  These are all available over-the-counter.  You just need to check the active ingredients on the label.  Topical heat can also help but do not sleep with the heating pad on. - For muscle cramping you can try magnesium and iron.  The iron dose would be ferrous sulfate 325 mg every day or every other day if every day has not tolerated.  For magnesium it would be magnesium oxide 400 mg.  If this upsets her stomach you can try 200 mg.  There is a form of magnesium called magnesium chloride (name brand Slow-Mag) that you can try and is typically more tolerable. - I would follow-up with your urologist regarding the blood in your sperm.  If you have an appointment with them in the next 1 to 2 months this can likely wait till the appointment, but if it is longer than that I would give them a call and let them know about the blood in your sperm.  Have a great day,  Frederic Jericho, MD

## 2023-03-28 ENCOUNTER — Encounter: Payer: Self-pay | Admitting: Family Medicine

## 2023-03-28 DIAGNOSIS — M1611 Unilateral primary osteoarthritis, right hip: Secondary | ICD-10-CM | POA: Insufficient documentation

## 2023-03-28 DIAGNOSIS — R361 Hematospermia: Secondary | ICD-10-CM | POA: Insufficient documentation

## 2023-03-28 NOTE — Assessment & Plan Note (Signed)
Has seen Dr. Christell Constant, orthopedist, for this in the past.  Recommended patient may require replacement at some point in the future.  With his symptoms worsening and worsening gait/imbalance I advised him to follow-up again with his orthopedist to reevaluate if replacement is appropriate at this time.  Recommended conservative therapy with topicals in the meantime.

## 2023-03-28 NOTE — Assessment & Plan Note (Signed)
Likely secondary to the radiation treatments.  Advised him to inform his urologist of this at their next visit.

## 2023-03-28 NOTE — Assessment & Plan Note (Signed)
Has just finished radiation therapy.  Continues to follow with oncology.

## 2023-03-28 NOTE — Assessment & Plan Note (Signed)
Blood pressure at goal today.  Continue to monitor.  Not on medications.

## 2023-03-28 NOTE — Assessment & Plan Note (Signed)
Patient taking Flomax but still has issues with urgency.  Advised him to continue discussions with his urologist.

## 2023-03-28 NOTE — Assessment & Plan Note (Signed)
Advised him he can stop his pravastatin due to myalgia, although it is uncertain if statin is what is causing his leg pain.  Advised to continue Zetia for now

## 2023-03-28 NOTE — Assessment & Plan Note (Signed)
Patient had cystoscopy but bladder stone had passed by that time.  Continue management per urology.

## 2023-04-09 ENCOUNTER — Encounter: Payer: Self-pay | Admitting: *Deleted

## 2023-04-09 ENCOUNTER — Inpatient Hospital Stay: Payer: Medicare Other | Attending: Nurse Practitioner | Admitting: *Deleted

## 2023-04-09 DIAGNOSIS — C61 Malignant neoplasm of prostate: Secondary | ICD-10-CM

## 2023-04-09 NOTE — Progress Notes (Signed)
SCP reviewed and completed. 

## 2023-06-25 ENCOUNTER — Encounter: Payer: Self-pay | Admitting: Family Medicine

## 2023-06-25 ENCOUNTER — Ambulatory Visit (INDEPENDENT_AMBULATORY_CARE_PROVIDER_SITE_OTHER): Payer: Medicare Other | Admitting: Family Medicine

## 2023-06-25 VITALS — BP 149/92 | HR 56 | Ht 71.0 in | Wt 248.1 lb

## 2023-06-25 DIAGNOSIS — E669 Obesity, unspecified: Secondary | ICD-10-CM

## 2023-06-25 DIAGNOSIS — N3942 Incontinence without sensory awareness: Secondary | ICD-10-CM

## 2023-06-25 DIAGNOSIS — R252 Cramp and spasm: Secondary | ICD-10-CM

## 2023-06-25 DIAGNOSIS — E782 Mixed hyperlipidemia: Secondary | ICD-10-CM

## 2023-06-25 DIAGNOSIS — C61 Malignant neoplasm of prostate: Secondary | ICD-10-CM | POA: Diagnosis not present

## 2023-06-25 DIAGNOSIS — N5235 Erectile dysfunction following radiation therapy: Secondary | ICD-10-CM

## 2023-06-25 DIAGNOSIS — M1611 Unilateral primary osteoarthritis, right hip: Secondary | ICD-10-CM

## 2023-06-25 MED ORDER — PHENTERMINE HCL 15 MG PO CAPS: 15.0000 mg | ORAL_CAPSULE | Freq: Every day | ORAL | 2 refills | Status: DC

## 2023-06-25 MED ORDER — SILDENAFIL CITRATE 50 MG PO TABS: 50.0000 mg | ORAL_TABLET | Freq: Every day | ORAL | 2 refills | Status: DC | PRN

## 2023-06-25 MED ORDER — EZETIMIBE 10 MG PO TABS: 10.0000 mg | ORAL_TABLET | Freq: Every day | ORAL | 2 refills | Status: DC

## 2023-06-25 NOTE — Patient Instructions (Addendum)
 It was nice to see you today,  We addressed the following topics today: -I have sent in a prescription of the Viagra.  Take 1 tablet by mouth daily as needed 1 hour before intercourse.  If this is not effective you may take 2 tablets. - I have sent in refills of your phentermine and your Zetia . - Pelvic floor exercises or Kegel exercises can be used to help with issues of erections and urinary incontinence.  We can discuss these further after your surgery at your next visit.  Have a great day,  Etha Henle, MD

## 2023-06-25 NOTE — Progress Notes (Unsigned)
   Established Patient Office Visit  Subjective   Patient ID: Mark Li, male    DOB: Jan 22, 1956  Age: 69 y.o. MRN: [id number]  Chief Complaint  Patient presents with  . Medical Management of Chronic Issues    HPI Subjective: - Scheduled for hip replacement surgery on 07/26/2023 - Reports PSA down to 1.6 - Erectile dysfunction since radiation therapy for prostate cancer - 100% of time, not able to achieve firm erection - Urinary symptoms - alternates Flomax  every 1.5 days to balance urinary flow and control - Weight stabilized after 56 lb loss. Taking 15mg  phentermine for the past few months.  Doesn't notice significant appetite change.   - No longer experiencing leg pain related to statins - Occasional leg cramping. Taking iron and slow-mag.   - Sleep apnea - uses CPAP inconsistently, mainly when waking during night - Hopeful hip replacement will improve mobility  The 10-year ASCVD risk score (Arnett DK, et al., 2019) is: 19.2%  Health Maintenance Due  Topic Date Due  . Hepatitis C Screening  Never done  . Colonoscopy  10/23/2016  . Zoster Vaccines- Shingrix (2 of 2) 03/02/2020  . COVID-19 Vaccine (5 - Pfizer risk 2024-25 season) 05/08/2023  . Medicare Annual Wellness (AWV)  07/11/2023      Objective:     BP (!) 149/92   Pulse (!) 56   Ht 5\' 11"  (1.803 m)   Wt 248 lb 1.9 oz (112.5 kg)   SpO2 98%   BMI 34.61 kg/m  {Vitals History (Optional):23777}  Physical Exam Gen: alert, oriented Pulm: lctab Psych: pleasant affect   No results found for any visits on 06/25/23.      Assessment & Plan:   Mixed hyperlipidemia -     Lipid panel; Future -     Comprehensive metabolic panel with GFR; Future  Other orders -     Sildenafil Citrate; Take 1 tablet (50 mg total) by mouth daily as needed for erectile dysfunction.  Dispense: 10 tablet; Refill: 2 -     Ezetimibe ; Take 1 tablet (10 mg total) by mouth daily.  Dispense: 30 tablet; Refill: 2 -     Phentermine  HCl; Take 1 capsule (15 mg total) by mouth daily.  Dispense: 30 capsule; Refill: 2     Return in about 3 months (around 09/24/2023) for hld, prostate.    Laneta Pintos, MD

## 2023-06-26 ENCOUNTER — Telehealth: Payer: Self-pay

## 2023-06-26 NOTE — Telephone Encounter (Signed)
 [facility] is requesting to have CMP and CBC with Diff/Plt ordered.

## 2023-06-27 DIAGNOSIS — R252 Cramp and spasm: Secondary | ICD-10-CM | POA: Insufficient documentation

## 2023-06-27 DIAGNOSIS — E669 Obesity, unspecified: Secondary | ICD-10-CM | POA: Insufficient documentation

## 2023-06-27 DIAGNOSIS — N529 Male erectile dysfunction, unspecified: Secondary | ICD-10-CM | POA: Insufficient documentation

## 2023-06-27 NOTE — Assessment & Plan Note (Signed)
 Continue phentermine.  Current bmi 34.

## 2023-06-27 NOTE — Assessment & Plan Note (Signed)
 Sending in viagra 50-100mg  daily prn 1 hr prior to intercourse.  Complains mostly of decreased erection quality

## 2023-06-27 NOTE — Assessment & Plan Note (Signed)
 Scheduled for hip replacment on 07/26/23

## 2023-06-27 NOTE — Assessment & Plan Note (Addendum)
 Continues to have issues with this.  We discussed pelvic floor exercises, potentially PT.  Pt has a PT he is familiar with and intends to discuss it with them after his hip surgery. Takes flomax  ever 1 to 2 days to balance bw incontinence and retention.  He feels this helps with the urinary incontience

## 2023-06-27 NOTE — Assessment & Plan Note (Signed)
 Having ED as a s/e of previous radiation.  Prescribed viagra prn

## 2023-06-27 NOTE — Assessment & Plan Note (Signed)
 Pt states slow-mag and iron have helped with muscle cramping.

## 2023-07-10 ENCOUNTER — Other Ambulatory Visit: Payer: Self-pay | Admitting: *Deleted

## 2023-07-10 DIAGNOSIS — E669 Obesity, unspecified: Secondary | ICD-10-CM

## 2023-07-10 DIAGNOSIS — I10 Essential (primary) hypertension: Secondary | ICD-10-CM

## 2023-07-11 ENCOUNTER — Other Ambulatory Visit

## 2023-07-11 DIAGNOSIS — E782 Mixed hyperlipidemia: Secondary | ICD-10-CM

## 2023-07-11 DIAGNOSIS — E669 Obesity, unspecified: Secondary | ICD-10-CM

## 2023-07-11 DIAGNOSIS — I10 Essential (primary) hypertension: Secondary | ICD-10-CM

## 2023-07-12 ENCOUNTER — Ambulatory Visit: Payer: Self-pay | Admitting: Family Medicine

## 2023-07-12 LAB — CBC WITH DIFFERENTIAL/PLATELET
Basophils Absolute: 0 10*3/uL (ref 0.0–0.2)
Basos: 1 %
EOS (ABSOLUTE): 0.2 10*3/uL (ref 0.0–0.4)
Eos: 4 %
Hematocrit: 49.1 % (ref 37.5–51.0)
Hemoglobin: 16.4 g/dL (ref 13.0–17.7)
Immature Grans (Abs): 0 10*3/uL (ref 0.0–0.1)
Immature Granulocytes: 1 %
Lymphocytes Absolute: 1.4 10*3/uL (ref 0.7–3.1)
Lymphs: 31 %
MCH: 31.8 pg (ref 26.6–33.0)
MCHC: 33.4 g/dL (ref 31.5–35.7)
MCV: 95 fL (ref 79–97)
Monocytes Absolute: 0.7 10*3/uL (ref 0.1–0.9)
Monocytes: 16 %
Neutrophils Absolute: 2.2 10*3/uL (ref 1.4–7.0)
Neutrophils: 47 %
Platelets: 194 10*3/uL (ref 150–450)
RBC: 5.15 x10E6/uL (ref 4.14–5.80)
RDW: 12.9 % (ref 11.6–15.4)
WBC: 4.6 10*3/uL (ref 3.4–10.8)

## 2023-07-12 LAB — COMPREHENSIVE METABOLIC PANEL WITH GFR
ALT: 29 IU/L (ref 0–44)
AST: 19 IU/L (ref 0–40)
Albumin: 4.2 g/dL (ref 3.9–4.9)
Alkaline Phosphatase: 87 IU/L (ref 44–121)
BUN/Creatinine Ratio: 18 (ref 10–24)
BUN: 16 mg/dL (ref 8–27)
Bilirubin Total: 0.5 mg/dL (ref 0.0–1.2)
CO2: 24 mmol/L (ref 20–29)
Calcium: 9.1 mg/dL (ref 8.6–10.2)
Chloride: 101 mmol/L (ref 96–106)
Creatinine, Ser: 0.89 mg/dL (ref 0.76–1.27)
Globulin, Total: 2.8 g/dL (ref 1.5–4.5)
Glucose: 86 mg/dL (ref 70–99)
Potassium: 4.5 mmol/L (ref 3.5–5.2)
Sodium: 141 mmol/L (ref 134–144)
Total Protein: 7 g/dL (ref 6.0–8.5)
eGFR: 93 mL/min/{1.73_m2} (ref >59)

## 2023-07-12 LAB — LIPID PANEL
Chol/HDL Ratio: 4.4 ratio (ref 0.0–5.0)
Cholesterol, Total: 246 mg/dL — ABNORMAL HIGH (ref 100–199)
HDL: 56 mg/dL (ref >39)
LDL Chol Calc (NIH): 176 mg/dL — ABNORMAL HIGH (ref 0–99)
Triglycerides: 83 mg/dL (ref 0–149)
VLDL Cholesterol Cal: 14 mg/dL (ref 5–40)

## 2023-07-12 LAB — HEMOGLOBIN A1C
Est. average glucose Bld gHb Est-mCnc: 100 mg/dL
Hgb A1c MFr Bld: 5.1 % (ref 4.8–5.6)

## 2023-07-13 ENCOUNTER — Ambulatory Visit: Payer: Self-pay | Admitting: Family Medicine

## 2023-07-13 ENCOUNTER — Other Ambulatory Visit: Payer: Self-pay | Admitting: Family Medicine

## 2023-07-13 MED ORDER — BEMPEDOIC ACID 180 MG PO TABS: 1.0000 | ORAL_TABLET | Freq: Every day | ORAL | 2 refills | Status: DC

## 2023-09-24 ENCOUNTER — Telehealth: Payer: Self-pay | Admitting: *Deleted

## 2023-09-24 NOTE — Telephone Encounter (Signed)
 Contacted pt about his cancelled medicare wellness and he stated that he would not reschedule at this time.  He asked if he had to do it and I informed him that medicare likes for patients to have this completed.  He stated he would wait till they call him another time.

## 2023-09-26 ENCOUNTER — Other Ambulatory Visit: Payer: Self-pay | Admitting: Family Medicine

## 2023-10-01 ENCOUNTER — Ambulatory Visit (INDEPENDENT_AMBULATORY_CARE_PROVIDER_SITE_OTHER): Admitting: Family Medicine

## 2023-10-01 ENCOUNTER — Encounter: Payer: Self-pay | Admitting: Family Medicine

## 2023-10-01 VITALS — BP 146/92 | HR 59 | Ht 71.0 in | Wt 249.0 lb

## 2023-10-01 DIAGNOSIS — E782 Mixed hyperlipidemia: Secondary | ICD-10-CM | POA: Diagnosis not present

## 2023-10-01 DIAGNOSIS — R21 Rash and other nonspecific skin eruption: Secondary | ICD-10-CM | POA: Diagnosis not present

## 2023-10-01 NOTE — Assessment & Plan Note (Signed)
 Stable on bempedoic acid  and ezetimibe . Was non-adherent for 5 weeks post-operatively but has since resumed treatment. Last cholesterol check was at the initiation of bempedoic acid . - Continue current medications. - Schedule fasting lipid panel in 6 weeks.

## 2023-10-01 NOTE — Progress Notes (Unsigned)
   Established Patient Office Visit  Subjective   Patient ID: Mark Li, male    DOB: 1955/08/16  Age: 68 y.o. MRN: [id number]  Chief Complaint  Patient presents with   Medical Management of Chronic Issues    HPI  Subjective - Post-right hip replacement, reports numbness in the muscle around the surgical site. Sensation has improved by two or three inches which is consistent with what was advised post-operatively. - Intermittent pruritic pustules on arms and foot. One lesion on the arm noted 3 days ago, another a week ago, and one on the foot two days ago. Denies finding ticks associated with these new lesions. Reports a tick bite on 06/15/2023. - Denies history of eczema or psoriasis.  Medications Currently taking bempedoic acid  and ezetimibe  for hyperlipidemia. Reports no current issues with these medications. Was non-adherent for five weeks post-hip replacement due to being on pain medications, but restarted a few weeks ago  PMH, PSH, FH, Social Hx PMHx: Hyperlipidemia, history of tick bite in April. PSH: Hip replacement. Social Hx: Uses [employer] for outdoor activities.  ROS Skin: Reports intermittent pruritic pustules. Denies itching at site of older lesion on wrist.    The 10-year ASCVD risk score (Arnett DK, et al., 2019) is: 20.7%  Health Maintenance Due  Topic Date Due   Hepatitis C Screening  Never done   Colonoscopy  10/23/2016   Zoster Vaccines- Shingrix (2 of 2) 03/02/2020   COVID-19 Vaccine (5 - Pfizer risk 2024-25 season) 05/08/2023   Medicare Annual Wellness (AWV)  07/11/2023   INFLUENZA VACCINE  09/28/2023      Objective:     BP (!) 146/89   Pulse (!) 59   Ht 5' 11 (1.803 m)   Wt 249 lb (112.9 kg)   SpO2 99%   BMI 34.73 kg/m    Physical Exam Gen: alert, oriented Pulm: no resp distress Skin: small hyperpigmented macules on the legs and arms b/l.  Raised papular lesion approx 1cm in diameter on anterior left bicep.   Small ruptured vesicle on anterior left forearm.     No results found for any visits on 10/01/23.      Assessment & Plan:   Mixed hyperlipidemia Assessment & Plan: Stable on bempedoic acid  and ezetimibe . Was non-adherent for 5 weeks post-operatively but has since resumed treatment. Last cholesterol check was at the initiation of bempedoic acid . - Continue current medications. - Schedule fasting lipid panel in 6 weeks.   Skin rash Assessment & Plan: Presents with intermittent, pruritic pustules on extremities with residual hyperpigmentation. Etiology is unclear, possibly related to insect bites, though no new ticks have been found. Denies history of other skin disorders. - Trial topical hydrocortisone  twice daily to affected areas for pruritus and hyperpigmentation. - If lesions persist at next follow-up, consider skin biopsy. - Schedule follow-up in 6 months for a physical.      Return in about 6 months (around 04/02/2024).    Toribio MARLA Slain, MD

## 2023-10-01 NOTE — Patient Instructions (Signed)
 It was nice to see you today,  We addressed the following topics today: -In 6 weeks we will recheck your cholesterol. - Use topical steroid like hydrocortisone  twice a day.  This can help with itching of your rash but also with the hyperpigmentation.  Have a great day,  Rolan Slain, MD

## 2023-10-01 NOTE — Assessment & Plan Note (Signed)
 Presents with intermittent, pruritic pustules on extremities with residual hyperpigmentation. Etiology is unclear, possibly related to insect bites, though no new ticks have been found. Denies history of other skin disorders. - Trial topical hydrocortisone  twice daily to affected areas for pruritus and hyperpigmentation. - If lesions persist at next follow-up, consider skin biopsy. - Schedule follow-up in 6 months for a physical.

## 2023-10-04 ENCOUNTER — Ambulatory Visit

## 2023-11-05 ENCOUNTER — Telehealth: Admitting: Physician Assistant

## 2023-11-05 ENCOUNTER — Other Ambulatory Visit: Payer: Self-pay | Admitting: Family Medicine

## 2023-11-05 DIAGNOSIS — Z23 Encounter for immunization: Secondary | ICD-10-CM

## 2023-11-05 DIAGNOSIS — Z9189 Other specified personal risk factors, not elsewhere classified: Secondary | ICD-10-CM

## 2023-11-05 MED ORDER — COVID-19 MRNA VACCINE (PFIZER) 30 MCG/0.3ML IM SUSP: 0.3000 mL | Freq: Once | INTRAMUSCULAR | 0 refills | Status: AC

## 2023-11-05 MED ORDER — MODERNA COVID-19 VACCINE 100 MCG/0.5ML IM SUSP: 0.5000 mL | Freq: Once | INTRAMUSCULAR | 0 refills | Status: AC

## 2023-11-05 NOTE — Progress Notes (Signed)
   Thank you for the details you included in the comment boxes. Those details are very helpful in determining the best course of treatment for you and help us  to provide the best care. Because of needing the Covid vaccine, we recommend that you schedule a Virtual Urgent Care video visit in order for the provider to better assess what is going on.  The provider will be able to give you a more accurate diagnosis and treatment plan if we can more freely discuss your symptoms and with the addition of a virtual examination.   If you change your visit to a video visit, we will bill your insurance (similar to an office visit) and you will not be charged for this e-Visit. You will be able to stay at home and speak with the first available [facility] advanced practice provider. The link to do a video visit is in the drop down Menu tab of your Welcome screen in MyChart.       I have spent 5 minutes in review of e-visit questionnaire, review and updating patient chart, medical decision making and response to patient.   [name], PA-C

## 2023-11-05 NOTE — Patient Instructions (Signed)
 Mark Li, thank you for joining [name], PA-C for today's virtual visit.  While this provider is not your primary care provider (PCP), if your PCP is located in our provider database this encounter information will be shared with them immediately following your visit.   A [facility] MyChart account gives you access to today's visit and all your visits, tests, and labs performed at [facility]  click here if you don't have a [facility] MyChart account or go to [email or web address]  Consent: (Patient) Mark Li provided verbal consent for this virtual visit at the beginning of the encounter.  Current Medications:  Current Outpatient Medications:    COVID-19 mRNA vaccine, Moderna, (MODERNA COVID-19 VACCINE ) 100 MCG/0.5ML injection, Inject 0.5 mLs into the muscle once for 1 dose., Disp: 0.5 mL, Rfl: 0   COVID-19 mRNA vaccine, Pfizer, 30 MCG/0.3ML injection, Inject 0.3 mLs into the muscle once for 1 dose., Disp: 0.3 mL, Rfl: 0   Bempedoic Acid  180 MG TABS, Take 1 tablet (180 mg total) by mouth daily at 6 (six) AM., Disp: 30 tablet, Rfl: 2   ezetimibe  (ZETIA ) 10 MG tablet, Take 1 tablet (10 mg total) by mouth daily., Disp: 30 tablet, Rfl: 2   ferrous sulfate 324 MG TBEC, Take 324 mg by mouth daily with breakfast. Pt takes EOD., Disp: , Rfl:    Incontinence Supply Disposable (INCONTINENCE BRIEF LARGE) MISC, Use as needed for urinary and bowel incontinence, Disp: 30 each, Rfl: 1   MAGNESIUM CL-CALCIUM CARBONATE PO, Take 1 tablet by mouth daily. Pt takes EOD., Disp: , Rfl:    NON FORMULARY, Pt uses a c-pap nightly, Disp: , Rfl:    phentermine  15 MG capsule, Take 1 capsule (15 mg total) by mouth daily., Disp: 30 capsule, Rfl: 2   sildenafil  (VIAGRA ) 50 MG tablet, Take 1 tablet (50 mg total) by mouth daily as needed for erectile dysfunction., Disp: 10 tablet, Rfl: 2   tamsulosin  (FLOMAX ) 0.4 MG CAPS capsule, Take 1 capsule (0.4 mg total) by mouth daily after  supper. (Patient taking differently: Take 0.4 mg by mouth 2 (two) times daily.), Disp: 90 capsule, Rfl: 3 No current facility-administered medications for this visit.  Facility-Administered Medications Ordered in Other Visits:    sodium phosphate  (FLEET) 7-19 GM/118ML enema 1 enema, 1 enema, Rectal, Once, Wrenn, John, MD   Medications ordered in this encounter:  Meds ordered this encounter  Medications   COVID-19 mRNA vaccine, Pfizer, 30 MCG/0.3ML injection    Sig: Inject 0.3 mLs into the muscle once for 1 dose.    Dispense:  0.3 mL    Refill:  0    Supervising Provider:   BLAISE ALEENE KIDD [[id number]]   COVID-19 mRNA vaccine, Moderna, (MODERNA COVID-19 VACCINE ) 100 MCG/0.5ML injection    Sig: Inject 0.5 mLs into the muscle once for 1 dose.    Dispense:  0.5 mL    Refill:  0    Supervising Provider:   BLAISE ALEENE KIDD [[id number]]     *If you need refills on other medications prior to your next appointment, please contact your pharmacy*  Follow-Up: Call back or seek an in-person evaluation if the symptoms worsen or if the condition fails to improve as anticipated.  [facility] [phone]    If you have been instructed to have an in-person evaluation today at a local Urgent Care facility, please use the link below. It will take you to a list of all of our available [facility], including address, phone number and hours of operation. Please do not delay care.  [facility]  If you or a family member do not have a primary care provider, use the link below to schedule a visit and establish care. When you choose a [facility] primary care physician or advanced practice provider, you gain a long-term partner in health. Find a Primary Care Provider  Learn more about [facility]'s in-office and virtual care options: [facility] - Get Care Now

## 2023-11-05 NOTE — Progress Notes (Signed)
 Virtual Visit Consent   Mark Li, you are scheduled for a virtual visit with a [facility] provider today. Just as with appointments in the office, your consent must be obtained to participate. Your consent will be active for this visit and any virtual visit you may have with one of our providers in the next 365 days. If you have a MyChart account, a copy of this consent can be sent to you electronically.  As this is a virtual visit, video technology does not allow for your provider to perform a traditional examination. This may limit your provider's ability to fully assess your condition. If your provider identifies any concerns that need to be evaluated in person or the need to arrange testing (such as labs, EKG, etc.), we will make arrangements to do so. Although advances in technology are sophisticated, we cannot ensure that it will always work on either your end or our end. If the connection with a video visit is poor, the visit may have to be switched to a telephone visit. With either a video or telephone visit, we are not always able to ensure that we have a secure connection.  By engaging in this virtual visit, you consent to the provision of healthcare and authorize for your insurance to be billed (if applicable) for the services provided during this visit. Depending on your insurance coverage, you may receive a charge related to this service.  I need to obtain your verbal consent now. Are you willing to proceed with your visit today? Mark Li has provided verbal consent on 11/05/2023 for a virtual visit (video or telephone). Mark CHRISTELLA Dickinson, PA-C  Date: 11/05/2023 6:22 PM   Virtual Visit via Video Note   I, Mark Li, connected with  Mark Li  ([id number], 19-Mar-1955) on 11/05/23 at  5:30 PM EDT by a video-enabled telemedicine application and verified that I am speaking with the correct person using two identifiers.  Location: Patient: Virtual Visit  Location Patient: Home Provider: Virtual Visit Location Provider: Home Office   I discussed the limitations of evaluation and management by telemedicine and the availability of in person appointments. The patient expressed understanding and agreed to proceed.    History of Present Illness: Mark Li is a 68 y.o. who identifies as a male who was assigned male at birth, and is being seen today for an prescription order for a Covid 19 vaccine. He has gotten the Covid 19 vaccines ever since they became available. He had been hospitalized in 2020 with covid and did code while there. Since this experience he has received his vaccines. He reports having both Moderna and Pfizer vaccines and has tolerated them all.   HPI: HPI  Problems:  Patient Active Problem List   Diagnosis Date Noted   Skin rash 10/01/2023   Obesity (BMI 30-39.9) 06/27/2023   Leg cramp 06/27/2023   Erectile dysfunction 06/27/2023   Hematospermia 03/28/2023   Osteoarthritis of right hip 03/28/2023   Bladder stone 02/06/2023   Urinary retention 12/04/2022   Acute prostatitis 12/04/2022   Urinary incontinence without sensory awareness 11/06/2022   Former smoker 07/11/2022   Prostate cancer (HCC) 05/26/2022   Chronic pain of both knees 01/17/2022   Hyperlipidemia 01/17/2022   Chronic bilateral thoracic back pain 01/13/2020   History of skin cancer 09/14/2017   Gastroesophageal reflux disease 03/03/2016   Essential hypertension 11/18/2015   Sleep apnea 10/15/2015   Insomnia 07/14/2015   Primary prostate adenocarcinoma (HCC) 07/14/2015  Sleep disturbance 03/20/2013    Allergies:  Allergies  Allergen Reactions   Azithromycin Dihydrate Nausea And Vomiting and Other (See Comments)   Amoxicillin Nausea And Vomiting   Other Nausea And Vomiting and Other (See Comments)    Patient stated he cannot tolerate ORAL ANTIBIOTICS   Penicillins Nausea And Vomiting    Did it involve swelling of the face/tongue/throat, SOB, or  low BP? Unk Did it involve sudden or severe rash/hives, skin peeling, or any reaction on the inside of your mouth or nose? Unk Did you need to seek medical attention at a hospital or doctor's office? Yes When did it last happen? Approx 20 years ago If all above answers are NO, may proceed with cephalosporin use.    Vitamin C  Nausea Only and Other (See Comments)    Irritated the stomach   Zinc  Nausea Only and Other (See Comments)    Irritated the stomach   Medications:  Current Outpatient Medications:    COVID-19 mRNA vaccine, Moderna, (MODERNA COVID-19 VACCINE ) 100 MCG/0.5ML injection, Inject 0.5 mLs into the muscle once for 1 dose., Disp: 0.5 mL, Rfl: 0   COVID-19 mRNA vaccine, Pfizer, 30 MCG/0.3ML injection, Inject 0.3 mLs into the muscle once for 1 dose., Disp: 0.3 mL, Rfl: 0   Bempedoic Acid  180 MG TABS, Take 1 tablet (180 mg total) by mouth daily at 6 (six) AM., Disp: 30 tablet, Rfl: 2   ezetimibe  (ZETIA ) 10 MG tablet, Take 1 tablet (10 mg total) by mouth daily., Disp: 30 tablet, Rfl: 2   ferrous sulfate 324 MG TBEC, Take 324 mg by mouth daily with breakfast. Pt takes EOD., Disp: , Rfl:    Incontinence Supply Disposable (INCONTINENCE BRIEF LARGE) MISC, Use as needed for urinary and bowel incontinence, Disp: 30 each, Rfl: 1   MAGNESIUM CL-CALCIUM CARBONATE PO, Take 1 tablet by mouth daily. Pt takes EOD., Disp: , Rfl:    NON FORMULARY, Pt uses a c-pap nightly, Disp: , Rfl:    phentermine  15 MG capsule, Take 1 capsule (15 mg total) by mouth daily., Disp: 30 capsule, Rfl: 2   sildenafil  (VIAGRA ) 50 MG tablet, Take 1 tablet (50 mg total) by mouth daily as needed for erectile dysfunction., Disp: 10 tablet, Rfl: 2   tamsulosin  (FLOMAX ) 0.4 MG CAPS capsule, Take 1 capsule (0.4 mg total) by mouth daily after supper. (Patient taking differently: Take 0.4 mg by mouth 2 (two) times daily.), Disp: 90 capsule, Rfl: 3 No current facility-administered medications for this  visit.  Facility-Administered Medications Ordered in Other Visits:    sodium phosphate  (FLEET) 7-19 GM/118ML enema 1 enema, 1 enema, Rectal, Once, Watt Rush, MD  Observations/Objective: Patient is well-developed, well-nourished in no acute distress.  Resting comfortably at home.  Head is normocephalic, atraumatic.  No labored breathing.  Speech is clear and coherent with logical content.  Patient is alert and oriented at baseline.    Assessment and Plan: 1. Need for COVID-19 vaccine (Primary) - COVID-19 mRNA vaccine, Pfizer, 30 MCG/0.3ML injection; Inject 0.3 mLs into the muscle once for 1 dose.  Dispense: 0.3 mL; Refill: 0 - COVID-19 mRNA vaccine, Moderna, (MODERNA COVID-19 VACCINE ) 100 MCG/0.5ML injection; Inject 0.5 mLs into the muscle once for 1 dose.  Dispense: 0.5 mL; Refill: 0  - Ordered the Pfizer and Moderna Covid vaccines to see which is covered best and sent to his pharmacy.  - He is aware to not get both, but to see which is cost effective - Follow up as needed  Follow Up Instructions: I discussed the assessment and treatment plan with the patient. The patient was provided an opportunity to ask questions and all were answered. The patient agreed with the plan and demonstrated an understanding of the instructions.  A copy of instructions were sent to the patient via MyChart unless otherwise noted below.    The patient was advised to call back or seek an in-person evaluation if the symptoms worsen or if the condition fails to improve as anticipated.    Mark CHRISTELLA Dickinson, PA-C

## 2023-11-13 ENCOUNTER — Other Ambulatory Visit: Payer: Self-pay | Admitting: Family Medicine

## 2023-11-20 ENCOUNTER — Other Ambulatory Visit

## 2023-11-20 DIAGNOSIS — E782 Mixed hyperlipidemia: Secondary | ICD-10-CM

## 2023-11-21 ENCOUNTER — Ambulatory Visit: Payer: Self-pay | Admitting: Family Medicine

## 2023-11-21 ENCOUNTER — Other Ambulatory Visit: Payer: Self-pay | Admitting: Family Medicine

## 2023-11-21 LAB — LIPID PANEL
Chol/HDL Ratio: 4.6 ratio (ref 0.0–5.0)
Cholesterol, Total: 227 mg/dL — ABNORMAL HIGH (ref 100–199)
HDL: 49 mg/dL (ref >39)
LDL Chol Calc (NIH): 156 mg/dL — ABNORMAL HIGH (ref 0–99)
Triglycerides: 120 mg/dL (ref 0–149)
VLDL Cholesterol Cal: 22 mg/dL (ref 5–40)

## 2023-11-21 MED ORDER — REPATHA 140 MG/ML ~~LOC~~ SOSY: 1.0000 mL | PREFILLED_SYRINGE | SUBCUTANEOUS | 1 refills | Status: DC

## 2023-11-21 NOTE — Progress Notes (Signed)
 Called patient he stated that he is agreeable to starting the repatha  now

## 2023-12-12 ENCOUNTER — Other Ambulatory Visit: Payer: Self-pay | Admitting: Family Medicine

## 2024-03-24 ENCOUNTER — Other Ambulatory Visit: Payer: Self-pay | Admitting: Family Medicine

## 2024-03-24 DIAGNOSIS — E782 Mixed hyperlipidemia: Secondary | ICD-10-CM

## 2024-03-26 ENCOUNTER — Other Ambulatory Visit

## 2024-03-26 DIAGNOSIS — E782 Mixed hyperlipidemia: Secondary | ICD-10-CM

## 2024-03-27 ENCOUNTER — Ambulatory Visit: Payer: Self-pay | Admitting: Family Medicine

## 2024-03-27 LAB — COMPREHENSIVE METABOLIC PANEL WITH GFR
ALT: 31 [IU]/L (ref 0–44)
AST: 23 [IU]/L (ref 0–40)
Albumin: 4.3 g/dL (ref 3.9–4.9)
Alkaline Phosphatase: 89 [IU]/L (ref 47–123)
BUN/Creatinine Ratio: 22 (ref 10–24)
BUN: 22 mg/dL (ref 8–27)
Bilirubin Total: 0.4 mg/dL (ref 0.0–1.2)
CO2: 22 mmol/L (ref 20–29)
Calcium: 8.9 mg/dL (ref 8.6–10.2)
Chloride: 104 mmol/L (ref 96–106)
Creatinine, Ser: 1.01 mg/dL (ref 0.76–1.27)
Globulin, Total: 2.2 g/dL (ref 1.5–4.5)
Glucose: 92 mg/dL (ref 70–99)
Potassium: 4.4 mmol/L (ref 3.5–5.2)
Sodium: 142 mmol/L (ref 134–144)
Total Protein: 6.5 g/dL (ref 6.0–8.5)
eGFR: 81 mL/min/{1.73_m2}

## 2024-03-27 LAB — LIPID PANEL
Chol/HDL Ratio: 4.6 ratio (ref 0.0–5.0)
Cholesterol, Total: 231 mg/dL — ABNORMAL HIGH (ref 100–199)
HDL: 50 mg/dL
LDL Chol Calc (NIH): 148 mg/dL — ABNORMAL HIGH (ref 0–99)
Triglycerides: 181 mg/dL — ABNORMAL HIGH (ref 0–149)
VLDL Cholesterol Cal: 33 mg/dL (ref 5–40)

## 2024-04-02 ENCOUNTER — Ambulatory Visit: Admitting: Family Medicine

## 2024-04-03 ENCOUNTER — Ambulatory Visit: Admitting: Family Medicine

## 2024-04-03 ENCOUNTER — Encounter: Payer: Self-pay | Admitting: Family Medicine

## 2024-04-03 VITALS — BP 126/76 | HR 74 | Ht 71.0 in | Wt 263.4 lb

## 2024-04-03 DIAGNOSIS — G8929 Other chronic pain: Secondary | ICD-10-CM

## 2024-04-03 DIAGNOSIS — E782 Mixed hyperlipidemia: Secondary | ICD-10-CM

## 2024-04-03 DIAGNOSIS — E669 Obesity, unspecified: Secondary | ICD-10-CM

## 2024-04-03 MED ORDER — EZETIMIBE 10 MG PO TABS: 10.0000 mg | ORAL_TABLET | Freq: Every day | ORAL | 3 refills | Status: AC

## 2024-04-03 MED ORDER — REPATHA 140 MG/ML ~~LOC~~ SOSY: 1.0000 mL | PREFILLED_SYRINGE | SUBCUTANEOUS | 3 refills | Status: AC

## 2024-04-03 NOTE — Progress Notes (Unsigned)
" ° ° °  Subjective   Patient ID: Mark Li, male    DOB: 14-Sep-1955  Age: 69 y.o. MRN: [id number]  Chief Complaint  Patient presents with   Medical Management of Chronic Issues     History of Present Illness   Mark Li is a 69 year old male with hyperlipidemia and knee pain who presents for evaluation of his cholesterol management and knee discomfort.  He reports persistently elevated cholesterol despite prior Repatha , last used about three months ago, and past Zetia . He had significant muscle symptoms with multiple statins over two years and is off statins due to intolerance.  He describes chronic knee pain worsened by recent jogging and increased activity. 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 He denies chest pain or shortness of breath with activity.  He has weight gain and worsening sleep apnea in the setting of this weight gain. He previously lost weight on Mounjaro but stopped it around June or July of last year and has since regained some weight.          The 10-year ASCVD risk score (Arnett DK, et al., 2019) is: 17.8%  Health Maintenance Due  Topic Date Due   Hepatitis C Screening  Never done   Colonoscopy  10/23/2016   Medicare Annual Wellness (AWV)  07/11/2023   COVID-19 Vaccine (5 - 2025-26 season) 10/29/2023      Objective:     BP 126/76   Pulse 74   Ht 5' 11 (1.803 m)   Wt 263 lb 6.4 oz (119.5 kg)   SpO2 99%   BMI 36.74 kg/m  {Vitals History (Optional):23777}  Physical Exam     Gen: alert, oriented Pulm: no respiratory distress Psych: pleasant affect       No results found for any visits on 04/03/24.      Assessment & Plan:   Chronic pain of both knees Assessment & Plan: Chronic knee pain worsened by jogging. Stiffness and limited motion. Previous chiropractic care effective. - Referred to  physical therapy for knee exercises. - Advised against jogging on ice; recommended elliptical, rowing, or swimming. - Follow up in July or August.  Orders: -     Ambulatory referral to Physical Therapy  Mixed hyperlipidemia Assessment & Plan: Persistent hyperlipidemia despite zetia  and benpedoic acid. Repatha  recently started. Discussed cardiovascular risk and need for further assessment. - Prescribed Repatha  with three months supply and three refills. - continue Zetia  with Repatha  for now. - Referred to cardiology for evaluation and potential stress test or coronary CT angiogram. - Follow up for labs 8 weeks after starting Repatha .   Obesity (BMI 30-39.9) Assessment & Plan: Weight gain post Zepbound success. Discussed alternative medications and coverage options. - Provided information on Ozempic and [id number]. - Discussed insurance coverage for weight loss medications related to sleep apnea.    Other orders -     Repatha ; Inject 140 mg into the skin every 14 (fourteen) days.  Dispense: 6 mL; Refill: 3 -     Ezetimibe ; Take 1 tablet (10 mg total) by mouth daily.  Dispense: 90 tablet; Refill: 3        Return in about 5 months (around 08/31/2024) for physical.    Mark MARLA Slain, MD  "

## 2024-04-03 NOTE — Assessment & Plan Note (Signed)
 Chronic knee pain worsened by jogging. Stiffness and limited motion. Previous chiropractic care effective. - Referred to physical therapy for knee exercises. - Advised against jogging on ice; recommended elliptical, rowing, or swimming. - Follow up in July or August.

## 2024-04-03 NOTE — Assessment & Plan Note (Signed)
 Weight gain post Zepbound success. Discussed alternative medications and coverage options. - Provided information on Ozempic and [id number]. - Discussed insurance coverage for weight loss medications related to sleep apnea.

## 2024-04-03 NOTE — Patient Instructions (Signed)
" ° °  YOUR PLAN: KNEE OSTEOARTHRITIS: Chronic knee pain worsened by jogging, with stiffness and limited motion. -Referred to physical therapy for knee exercises. -Advised against jogging; recommended using an elliptical, rowing, or swimming. -Follow up in July or August.  HYPERLIPIDEMIA: Persistent high cholesterol despite previous medications. -Prescribed Repatha  with a three-month supply and three refills. -Prescribed Zetia  to take with Repatha . -Referred to cardiology for evaluation and potential stress test or coronary CT angiogram. -Follow up for labs 8 weeks after starting Repatha .  OBESITY: Weight gain after stopping previous medication. -Provided information on Wegovy. -Discussed insurance coverage for weight loss medications related to sleep apnea.   "

## 2024-04-03 NOTE — Assessment & Plan Note (Signed)
 Persistent hyperlipidemia despite zetia  and benpedoic acid. Repatha  recently started. Discussed cardiovascular risk and need for further assessment. - Prescribed Repatha  with three months supply and three refills. - continue Zetia  with Repatha  for now. - Referred to cardiology for evaluation and potential stress test or coronary CT angiogram. - Follow up for labs 8 weeks after starting Repatha .

## 2024-06-12 ENCOUNTER — Other Ambulatory Visit

## 2024-08-27 ENCOUNTER — Other Ambulatory Visit

## 2024-09-04 ENCOUNTER — Encounter: Admitting: Family Medicine
